# Patient Record
Sex: Female | Born: 1995 | Race: Black or African American | Hispanic: No | Marital: Single | Smoking: Former smoker
Health system: Southern US, Community
[De-identification: ages and names within clinical notes are randomized; demographics above are authoritative.]

## PROBLEM LIST (undated history)

## (undated) DIAGNOSIS — J3089 Other allergic rhinitis: Secondary | ICD-10-CM

## (undated) DIAGNOSIS — J45909 Unspecified asthma, uncomplicated: Secondary | ICD-10-CM

## (undated) DIAGNOSIS — L309 Dermatitis, unspecified: Secondary | ICD-10-CM

---

## 2018-10-21 ENCOUNTER — Emergency Department (HOSPITAL_BASED_OUTPATIENT_CLINIC_OR_DEPARTMENT_OTHER)
Admission: EM | Admit: 2018-10-21 | Discharge: 2018-10-22 | Disposition: A | Discharge status: Home / Self Care | Payer: Medicaid Other | Attending: Emergency Medicine | Admitting: Emergency Medicine

## 2018-10-21 ENCOUNTER — Encounter (HOSPITAL_BASED_OUTPATIENT_CLINIC_OR_DEPARTMENT_OTHER): Payer: Self-pay | Admitting: Emergency Medicine

## 2018-10-21 ENCOUNTER — Other Ambulatory Visit: Payer: Self-pay

## 2018-10-21 ENCOUNTER — Emergency Department (HOSPITAL_BASED_OUTPATIENT_CLINIC_OR_DEPARTMENT_OTHER)
Admission: EM | Admit: 2018-10-21 | Discharge: 2018-10-21 | Discharge status: Against Medical Advice | Payer: Medicaid Other | Attending: Emergency Medicine | Admitting: Emergency Medicine

## 2018-10-21 ENCOUNTER — Emergency Department (HOSPITAL_BASED_OUTPATIENT_CLINIC_OR_DEPARTMENT_OTHER): Payer: Medicaid Other

## 2018-10-21 DIAGNOSIS — Y999 Unspecified external cause status: Secondary | ICD-10-CM | POA: Diagnosis not present

## 2018-10-21 DIAGNOSIS — S60221A Contusion of right hand, initial encounter: Secondary | ICD-10-CM | POA: Diagnosis not present

## 2018-10-21 DIAGNOSIS — Y929 Unspecified place or not applicable: Secondary | ICD-10-CM | POA: Diagnosis not present

## 2018-10-21 DIAGNOSIS — W2209XA Striking against other stationary object, initial encounter: Secondary | ICD-10-CM | POA: Insufficient documentation

## 2018-10-21 DIAGNOSIS — Y939 Activity, unspecified: Secondary | ICD-10-CM | POA: Insufficient documentation

## 2018-10-21 DIAGNOSIS — S6981XA Other specified injuries of right wrist, hand and finger(s), initial encounter: Secondary | ICD-10-CM | POA: Diagnosis present

## 2018-10-21 MED ORDER — NAPROXEN 250 MG PO TABS
500.0000 mg | ORAL_TABLET | Freq: Once | ORAL | Status: AC
  Administered 2018-10-22: 500 mg via ORAL
  Filled 2018-10-21: qty 2

## 2018-10-21 NOTE — ED Triage Notes (Addendum)
Monday patient became upset and punched a thick mirror. Her knuckle was brusied but is now describing pain in the 5th metacarpal area of her right hand

## 2018-10-21 NOTE — ED Provider Notes (Addendum)
[facility] Provider Note: Georgena Spurling, MD, FACEP  CSN: [id number] MRN: [id number] ARRIVAL: 10/21/18 at 2325 ROOM: [place] [facility]   HISTORY OF PRESENT ILLNESS  10/21/18 11:46 PM Elizabeth Palmer is a 23 y.o. female who became angry 4 days ago and punched a thick mirror.  She had bruising of her right hand which is resolved but continues to have pain in the right fifth metacarpal.  She rates her pain as a 7 out of 10, aching in nature, worse with movement or palpation.  She also notes the pain is intermittent.  She presents this evening because the pain is been persistent.   History reviewed. No pertinent past medical history.  History reviewed. No pertinent surgical history.  No family history on file.  Social History   Tobacco Use  . Smoking status: Not on file  Substance Use Topics  . Alcohol use: Not on file  . Drug use: Not on file    Prior to Admission medications   Not on File    Allergies Patient has no known allergies.   REVIEW OF SYSTEMS  Negative except as noted here or in the History of Present Illness.   PHYSICAL EXAMINATION  Initial Vital Signs Blood pressure 122/72, pulse 68, temperature 98.1 F (36.7 C), temperature source Oral, resp. rate 18, height 5\' 3"  (1.6 m), weight 83.9 kg, last menstrual period 09/20/2018, SpO2 98 %.  Examination General: Well-developed, well-nourished female in no acute distress; appearance consistent with age of record HENT: normocephalic; atraumatic Eyes: Normal appearance Neck: supple Heart: regular rate and rhythm Lungs: clear to auscultation bilaterally Abdomen: soft; nondistended; nontender Extremities: No deformity; full range of motion; pulses normal; tenderness over right fifth metacarpal without crepitus or significant swelling Neurologic: Awake, alert and oriented; motor function intact in all extremities and symmetric; no facial droop Skin: Warm and dry  Psychiatric: Normal mood and affect   RESULTS  Summary of this visit's results, reviewed by myself:   EKG Interpretation  Date/Time:    Ventricular Rate:    PR Interval:    QRS Duration:   QT Interval:    QTC Calculation:   R Axis:     Text Interpretation:        Laboratory Studies: No results found for this or any previous visit (from the past 24 hour(s)). Imaging Studies: Dg Hand Complete Right  Result Date: 10/21/2018 CLINICAL DATA:  Punched a mirror EXAM: RIGHT HAND - COMPLETE 3+ VIEW COMPARISON:  None. FINDINGS: There is no evidence of fracture or dislocation. There is no evidence of arthropathy or other focal bone abnormality. Soft tissues are unremarkable. IMPRESSION: Negative. Electronically Signed   By: Donavan Foil M.D.   On: 10/21/2018 23:49    ED COURSE and MDM  Nursing notes and initial vitals signs, including pulse oximetry, reviewed.  Vitals:   10/21/18 2335 10/21/18 2338  BP:  122/72  Pulse:  68  Resp:  18  Temp:  98.1 F (36.7 C)  TempSrc:  Oral  SpO2:  98%  Weight: 83.9 kg   Height: 5\' 3"  (1.6 m)    No evidence of fracture on radiograph or physical examination.  Patient advised that punching a pillow is a safer alternative to punching a solid object.  She was advised to take over-the-counter acetaminophen or NSAIDs as needed for pain.  PROCEDURES    ED DIAGNOSES     ICD-10-CM   1. Contusion of right hand, initial encounter  [id number]        Paula LibraMolpus, Karmela Bram, MD 10/21/18 2356    Paula LibraMolpus, Daylani Deblois, MD 10/21/18 [phone]

## 2018-11-20 ENCOUNTER — Encounter (HOSPITAL_BASED_OUTPATIENT_CLINIC_OR_DEPARTMENT_OTHER): Payer: Self-pay

## 2018-11-20 ENCOUNTER — Emergency Department (HOSPITAL_BASED_OUTPATIENT_CLINIC_OR_DEPARTMENT_OTHER)
Admission: EM | Admit: 2018-11-20 | Discharge: 2018-11-21 | Disposition: A | Discharge status: Home / Self Care | Payer: Medicaid Other | Attending: Emergency Medicine | Admitting: Emergency Medicine

## 2018-11-20 ENCOUNTER — Emergency Department (HOSPITAL_BASED_OUTPATIENT_CLINIC_OR_DEPARTMENT_OTHER): Payer: Medicaid Other

## 2018-11-20 ENCOUNTER — Other Ambulatory Visit: Payer: Self-pay

## 2018-11-20 DIAGNOSIS — R197 Diarrhea, unspecified: Secondary | ICD-10-CM | POA: Diagnosis not present

## 2018-11-20 DIAGNOSIS — R1030 Lower abdominal pain, unspecified: Secondary | ICD-10-CM | POA: Diagnosis present

## 2018-11-20 LAB — URINALYSIS, MICROSCOPIC (REFLEX): RBC / HPF: NONE SEEN RBC/hpf (ref 0–5)

## 2018-11-20 LAB — PREGNANCY, URINE: Preg Test, Ur: NEGATIVE

## 2018-11-20 LAB — URINALYSIS, ROUTINE W REFLEX MICROSCOPIC
Bilirubin Urine: NEGATIVE
Glucose, UA: NEGATIVE mg/dL
Hgb urine dipstick: NEGATIVE
Ketones, ur: NEGATIVE mg/dL
Nitrite: NEGATIVE
Protein, ur: NEGATIVE mg/dL
Specific Gravity, Urine: >1.03 — ABNORMAL HIGH (ref 1.005–1.030)
pH: 6 (ref 5.0–8.0)

## 2018-11-20 MED ORDER — KETOROLAC TROMETHAMINE 30 MG/ML IJ SOLN
30.0000 mg | Freq: Once | INTRAMUSCULAR | Status: AC
  Administered 2018-11-20: 23:00:00 30 mg via INTRAMUSCULAR
  Filled 2018-11-20: qty 1

## 2018-11-20 MED ORDER — DICYCLOMINE HCL 20 MG PO TABS: 20.0000 mg | ORAL_TABLET | Freq: Two times a day (BID) | ORAL | 0 refills | Status: DC

## 2018-11-20 NOTE — ED Provider Notes (Signed)
[facility] EMERGENCY DEPARTMENT Provider Note   CSN: [id number] Arrival date & time: 11/20/18  2024     History   Chief Complaint Chief Complaint  Patient presents with  . Abdominal Pain    HPI Elizabeth Palmer is a 23 y.o. female.     HPI  This is a 23 year old female with no significant past medical history who presents with abdominal pain.  Patient reports 1 month history of waxing and waning abdominal pain.  She describes it as crampy in nature in the lower abdomen.  It is midline and does not lateralize.  She initially thought it was related to her period.  Last menstrual period was at the beginning of this month.  She describes having crampy symptoms before, during, and after her period.  However, the symptoms have persisted intermittently since that time.  She had symptoms tonight which provoked her to come in.  She did see her gynecologist and reports having a pelvic exam and STD testing that was reassuring.  She has been taking ibuprofen with some relief.  Currently she rates her pain at 3 out of 10.  She does report some nonbloody diarrhea which sometimes makes her pain feel better.  Denies any vomiting.  No fevers.  Denies urinary symptoms or vaginal discharge.  History reviewed. No pertinent past medical history.  There are no active problems to display for this patient.   History reviewed. No pertinent surgical history.   OB History   No obstetric history on file.      Home Medications    Prior to Admission medications   Medication Sig Start Date End Date Taking? Authorizing Provider  dicyclomine (BENTYL) 20 MG tablet Take 1 tablet (20 mg total) by mouth 2 (two) times daily. 11/20/18   Jori Frerichs, Mayer Maskerourtney F, MD    Family History No family history on file.  Social History Social History   Tobacco Use  . Smoking status: Never Smoker  Substance Use Topics  . Alcohol use: Yes    Frequency: Never  . Drug use: Never     Allergies   Patient has  no known allergies.   Review of Systems Review of Systems  Constitutional: Negative for chills and fever.  Respiratory: Negative for chest tightness.   Cardiovascular: Negative for chest pain.  Gastrointestinal: Positive for abdominal pain and diarrhea. Negative for nausea and vomiting.  Genitourinary: Negative for dysuria.  All other systems reviewed and are negative.    Physical Exam Updated Vital Signs BP 121/74 (BP Location: Right Arm)   Pulse 64   Temp 98.1 F (36.7 C) (Oral)   Resp 18   Ht 1.6 m (5\' 3" )   Wt 83 kg   LMP 11/02/2018   SpO2 100%   BMI 32.42 kg/m   Physical Exam Vitals signs and nursing note reviewed.  Constitutional:      Appearance: She is well-developed. She is obese.  HENT:     Head: Normocephalic and atraumatic.  Eyes:     Pupils: Pupils are equal, round, and reactive to light.  Neck:     Musculoskeletal: Neck supple.  Cardiovascular:     Rate and Rhythm: Normal rate and regular rhythm.     Heart sounds: Normal heart sounds.  Pulmonary:     Effort: Pulmonary effort is normal. No respiratory distress.     Breath sounds: No wheezing.  Abdominal:     General: Bowel sounds are normal.     Palpations: Abdomen is soft.  Tenderness: There is no abdominal tenderness. There is no guarding or rebound.  Genitourinary:    Comments: Deferred Skin:    General: Skin is warm and dry.  Neurological:     Mental Status: She is alert and oriented to person, place, and time.      ED Treatments / Results  Labs (all labs ordered are listed, but only abnormal results are displayed) Labs Reviewed  URINALYSIS, ROUTINE W REFLEX MICROSCOPIC - Abnormal; Notable for the following components:      Result Value   APPearance CLOUDY (*)    Specific Gravity, Urine >1.030 (*)    Leukocytes,Ua SMALL (*)    All other components within normal limits  URINALYSIS, MICROSCOPIC (REFLEX) - Abnormal; Notable for the following components:   Bacteria, UA RARE (*)     All other components within normal limits  PREGNANCY, URINE    EKG None  Radiology Dg Abdomen 1 View  Result Date: 11/20/2018 CLINICAL DATA:  Abdomen pain EXAM: ABDOMEN - 1 VIEW COMPARISON:  None. FINDINGS: The bowel gas pattern is normal. No radio-opaque calculi or other significant radiographic abnormality are seen. IMPRESSION: Negative. Electronically Signed   By: Donavan Foil M.D.   On: 11/20/2018 23:18    Procedures Procedures (including critical care time)  Medications Ordered in ED Medications  ketorolac (TORADOL) 30 MG/ML injection 30 mg (30 mg Intramuscular Given 11/20/18 2319)     Initial Impression / Assessment and Plan / ED Course  I have reviewed the triage vital signs and the nursing notes.  Pertinent labs & imaging results that were available during my care of the patient were reviewed by me and considered in my medical decision making (see chart for details).        Patient presents with crampy lower abdominal pain.  She is overall nontoxic-appearing and vital signs are reassuring.  Her abdominal exam is benign.  She reports recent pelvic exam which was unrevealing by her gynecologist.  This was deferred as she has a benign abdomen.  Urinalysis and urine pregnancy are reassuring.  I did get a plain film of her abdomen which shows a normal bowel gas pattern.  Given the diarrhea, patient may have undiagnosed IBS given her benign exam.  Low suspicion for appendicitis, ovarian torsion, ovarian cyst.  Will place on Bentyl and provide GI follow-up.  After history, exam, and medical workup I feel the patient has been appropriately medically screened and is safe for discharge home. Pertinent diagnoses were discussed with the patient. Patient was given return precautions.   Final Clinical Impressions(s) / ED Diagnoses   Final diagnoses:  Lower abdominal pain  Diarrhea, unspecified type    ED Discharge Orders         Ordered    dicyclomine (BENTYL) 20 MG tablet  2  times daily     11/20/18 2346           Kaimen Peine, Barbette Hair, MD 11/21/18 0000

## 2018-11-20 NOTE — Discharge Instructions (Addendum)
You were seen today for abdominal pain and diarrhea.  Your x-ray is reassuring.  Try Bentyl at home to see if this helps.  Given the diarrhea, you may need to follow-up with gastroenterology if symptoms are not improving.

## 2018-11-20 NOTE — ED Triage Notes (Signed)
Pt reports abdominal pain/pelvic pain x 1 month. Pt reports hx of ovarian cysts. Pt reports pain feels like menstrual cramps but started before her period and has lasted after. Pt does report diarrhea. Pt saw OBGYN with swabs but no [place]. Pt was informed it was just gas.

## 2019-09-20 ENCOUNTER — Emergency Department (HOSPITAL_BASED_OUTPATIENT_CLINIC_OR_DEPARTMENT_OTHER)
Admission: EM | Admit: 2019-09-20 | Discharge: 2019-09-20 | Disposition: A | Discharge status: Home / Self Care | Payer: Medicaid Other | Attending: Emergency Medicine | Admitting: Emergency Medicine

## 2019-09-20 ENCOUNTER — Encounter (HOSPITAL_BASED_OUTPATIENT_CLINIC_OR_DEPARTMENT_OTHER): Payer: Self-pay | Admitting: *Deleted

## 2019-09-20 ENCOUNTER — Other Ambulatory Visit: Payer: Self-pay

## 2019-09-20 DIAGNOSIS — R519 Headache, unspecified: Secondary | ICD-10-CM

## 2019-09-20 DIAGNOSIS — Z79899 Other long term (current) drug therapy: Secondary | ICD-10-CM | POA: Diagnosis not present

## 2019-09-20 LAB — PREGNANCY, URINE: Preg Test, Ur: NEGATIVE

## 2019-09-20 MED ORDER — DIPHENHYDRAMINE HCL 50 MG/ML IJ SOLN: 25.0000 mg | Freq: Once | INTRAMUSCULAR | Status: DC

## 2019-09-20 MED ORDER — SODIUM CHLORIDE 0.9 % IV BOLUS: 500.0000 mL | Freq: Once | INTRAVENOUS | Status: DC

## 2019-09-20 MED ORDER — METOCLOPRAMIDE HCL 5 MG/ML IJ SOLN: 10.0000 mg | Freq: Once | INTRAMUSCULAR | Status: DC

## 2019-09-20 MED ORDER — DIPHENHYDRAMINE HCL 25 MG PO CAPS
25.0000 mg | ORAL_CAPSULE | Freq: Once | ORAL | Status: AC
  Administered 2019-09-20: 25 mg via ORAL
  Filled 2019-09-20: qty 1

## 2019-09-20 MED ORDER — METOCLOPRAMIDE HCL 10 MG PO TABS
5.0000 mg | ORAL_TABLET | Freq: Once | ORAL | Status: AC
  Administered 2019-09-20: 5 mg via ORAL
  Filled 2019-09-20: qty 1

## 2019-09-20 NOTE — ED Triage Notes (Signed)
Presents with HA with onset approx 3 days ago, HA is more active when out in the heat

## 2019-09-20 NOTE — ED Provider Notes (Signed)
[facility] EMERGENCY DEPARTMENT Provider Note   CSN: [id number] Arrival date & time: 09/20/19  0913     History Chief Complaint  Patient presents with  . Headache    Elizabeth Palmer is a 24 y.o. female otherwise healthy no daily medication use.  Patient presents today with headache onset 4 days ago described as a frontal throbbing sensation occasionally moves around to different spots of her head, moderate in intensity.  Patient reports that Saturday, 09/16/2019 when the headache began it was mild but gradually worsened throughout the day. She reports headache is worsened with bright light and occasionally when she is out in the hot weather.  It improves with OTC medications such as ibuprofen last taken yesterday, no medications today.  Patient reports that she has been experiencing similar headaches for the past 1-2 years but has not seen a medical provider for this in the past.  She reports that her headache today is very similar to the headache she has been experiencing for the past 1-2 years however feels that has been lasting longer than normal.  Patient is accompanied by her mother today, mother reports that she herself experiences similar headaches and migraines since she was young.  They are requesting referral to a neurologist.  Denies recent illness, no fevers/chills, denies vision changes, dizziness, difficulty speaking, neck stiffness, chest pain/shortness of breath, abdominal pain, vomiting/diarrhea, numbness/weakness, tingling, sudden onset of pain, abnormal features to the headache or any additional concerns.  HPI     History reviewed. No pertinent past medical history.  There are no problems to display for this patient.   History reviewed. No pertinent surgical history.   OB History   No obstetric history on file.     History reviewed. No pertinent family history.  Social History   Tobacco Use  . Smoking status: Never Smoker  Substance Use Topics    . Alcohol use: Yes  . Drug use: Never    Home Medications Prior to Admission medications   Medication Sig Start Date End Date Taking? Authorizing Provider  dicyclomine (BENTYL) 20 MG tablet Take 1 tablet (20 mg total) by mouth 2 (two) times daily. 11/20/18   Horton, Mayer Masker, MD    Allergies    Patient has no known allergies.  Review of Systems   Review of Systems Ten systems are reviewed and are negative for acute change except as noted in the HPI  Physical Exam Updated Vital Signs BP 120/79 (BP Location: Right Arm)   Pulse 66   Temp 97.9 F (36.6 C) (Oral)   Resp 16   Ht 5\' 2"  (1.575 m)   Wt 87.5 kg   SpO2 100%   BMI 35.30 kg/m   Physical Exam Constitutional:      General: She is not in acute distress.    Appearance: Normal appearance. She is well-developed. She is not ill-appearing or diaphoretic.  HENT:     Head: Normocephalic and atraumatic.     Jaw: There is normal jaw occlusion.     Right Ear: External ear normal.     Left Ear: External ear normal.     Nose: Nose normal.     Mouth/Throat:     Mouth: Mucous membranes are moist.     Pharynx: Oropharynx is clear.  Eyes:     General: Vision grossly intact. Gaze aligned appropriately.     Pupils: Pupils are equal, round, and reactive to light.  Neck:     Trachea: Trachea and phonation normal.  No tracheal deviation.     Meningeal: Brudzinski's sign absent.  Pulmonary:     Effort: Pulmonary effort is normal. No respiratory distress.  Abdominal:     General: There is no distension.     Palpations: Abdomen is soft.     Tenderness: There is no abdominal tenderness. There is no guarding or rebound.  Musculoskeletal:        General: Normal range of motion.     Cervical back: Full passive range of motion without pain, normal range of motion and neck supple.  Skin:    General: Skin is warm and dry.  Neurological:     Mental Status: She is alert.     GCS: GCS eye subscore is 4. GCS verbal subscore is 5. GCS  motor subscore is 6.     Comments: Mental Status: Alert, oriented, thought content appropriate, able to give a coherent history. Speech fluent without evidence of aphasia. Able to follow 2 step commands without difficulty. Cranial Nerves: II: Peripheral visual fields grossly normal, pupils equal, round, reactive to light III,IV, VI: ptosis not present, extra-ocular motions intact bilaterally V,VII: smile symmetric, eyebrows raise symmetric, facial light touch sensation equal VIII: hearing grossly normal to voice X: uvula elevates symmetrically XI: bilateral shoulder shrug symmetric and strong XII: midline tongue extension without fassiculations Motor: Normal tone. 5/5 strength in upper and lower extremities bilaterally including strong and equal grip strength and dorsiflexion/plantar flexion Sensory: Sensation intact to light touch in all extremities.Negative Romberg.  Cerebellar: normal finger-to-nose with bilateral upper extremities. Normal heel-to -shin balance bilaterally of the lower extremity. No pronator drift.  Gait: normal gait and balance CV: distal pulses palpable throughout  Psychiatric:        Behavior: Behavior normal.     ED Results / Procedures / Treatments   Labs (all labs ordered are listed, but only abnormal results are displayed) Labs Reviewed  PREGNANCY, URINE    EKG None  Radiology No results found.  Procedures Procedures (including critical care time)  Medications Ordered in ED Medications  metoCLOPramide (REGLAN) tablet 5 mg (5 mg Oral Given 09/20/19 1038)  diphenhydrAMINE (BENADRYL) capsule 25 mg (25 mg Oral Given 09/20/19 1039)    ED Course  I have reviewed the triage vital signs and the nursing notes.  Pertinent labs & imaging results that were available during my care of the patient were reviewed by me and considered in my medical decision making (see chart for details).    MDM Rules/Calculators/A&P                      Additional  History Obtained: 1. Nursing notes from this visit. 2. Patient's mother at bedside.  24 year old female otherwise healthy no daily medication use presents today with headache onset 4 days ago.  She reports is consistent with previous headaches however longer in duration and slightly worse.  She has been treating successfully at home with OTC anti-inflammatories however has not had any today.  Physical examination is reassuring no neuro deficits.  She denies any red flag symptoms such as sudden onset, syncope, recent illness or trauma.  She has no meningeal signs on examination no tenderness about the cervical facial or temporal arteries.  No other red flag symptoms today.  She is well-appearing and in no acute distress.  She is walking around the emergency department and laughing with nursing staff.  Plan of care was to obtain a pregnancy test and to start IV to give migraine cocktail  however patient refused IV medications.  I have ordered p.o. Reglan and Benadryl instead.  Plan of care is reassessment after medication.  There is no indication for CT imaging or blood work at this time.  I did have a discussion regarding potential imaging with patient and her mother today, they are in agreement and do not want any imaging or blood work which I feel is reasonable. - I have reviewed and interpreted the following labs. Urine pregnancy test negative. - Patient reevaluated well-appearing pleasant no acute distress sitting in bed.  She reports that she is feeling better and would like to be discharged.  She is requesting a work note and a referral to a neurologist.  Both of which will be provided. The patient denies any neurologic symptoms such as visual changes, focal numbness/weakness, balance problems, confusion, or speech difficulty to suggest a life-threatening intracranial process such as intracranial hemorrhage or mass. The patient has no clotting risk factors thus venous sinus thrombosis is unlikely. No  fevers, neck pain or nuchal rigidity to suggest meningitis. Patient is afebrile, non-toxic and well appearing. Reassuring neuro exam, normal gait around room and back. No cranial deficits, no speech deficits, negative pronator drift, normal/equal strength to all extremities.  Both patient and mother report they have no other concerns today and both request discharge.  At this time there does not appear to be any evidence of an acute emergency medical condition and the patient appears stable for discharge with appropriate outpatient follow up. Diagnosis was discussed with patient and mother who verbalizes understanding of care plan and is agreeable to discharge. I have discussed return precautions with patient and mother who verbalizes understanding. Patient encouraged to follow-up with their PCP. All questions answered.  Note: Portions of this report may have been transcribed using voice recognition software. Every effort was made to ensure accuracy; however, inadvertent computerized transcription errors may still be present. Final Clinical Impression(s) / ED Diagnoses Final diagnoses:  Nonintractable headache, unspecified chronicity pattern, unspecified headache type    Rx / DC Orders ED Discharge Orders    None       Elizabeth Palau 09/20/19 1158    Pricilla Loveless, MD 09/21/19 [phone]

## 2019-09-20 NOTE — Discharge Instructions (Signed)
At this time there does not appear to be the presence of an emergent medical condition, however there is always the potential for conditions to change. Please read and follow the below instructions.  Please return to the Emergency Department immediately for any new or worsening symptoms. Please be sure to follow up with your Primary Care Provider within one week regarding your visit today; please call their office to schedule an appointment even if you are feeling better for a follow-up visit. You may call the specialist at [facility] to schedule a follow-up appointment.  Get help right away if: Your headache gets very bad quickly. Your headache gets worse after a lot of physical activity. You keep throwing up. You have a stiff neck. You have trouble seeing. You have trouble speaking. You have pain in the eye or ear. Your muscles are weak or you lose muscle control. You lose your balance or have trouble walking. You feel like you will pass out (faint) or you pass out. You are mixed up (confused). You have a seizure. You have fever or chills You have any new/concerning or worsening of symptoms  Please read the additional information packets attached to your discharge summary.  Do not take your medicine if  develop an itchy rash, swelling in your mouth or lips, or difficulty breathing; call 911 and seek immediate emergency medical attention if this occurs.  Note: Portions of this text may have been transcribed using voice recognition software. Every effort was made to ensure accuracy; however, inadvertent computerized transcription errors may still be present.

## 2020-05-15 ENCOUNTER — Emergency Department (HOSPITAL_BASED_OUTPATIENT_CLINIC_OR_DEPARTMENT_OTHER)
Admission: EM | Admit: 2020-05-15 | Discharge: 2020-05-15 | Disposition: A | Discharge status: Home / Self Care | Payer: Medicaid Other | Attending: Emergency Medicine | Admitting: Emergency Medicine

## 2020-05-15 ENCOUNTER — Encounter (HOSPITAL_BASED_OUTPATIENT_CLINIC_OR_DEPARTMENT_OTHER): Payer: Self-pay | Admitting: Emergency Medicine

## 2020-05-15 ENCOUNTER — Other Ambulatory Visit: Payer: Self-pay

## 2020-05-15 DIAGNOSIS — R110 Nausea: Secondary | ICD-10-CM | POA: Diagnosis present

## 2020-05-15 DIAGNOSIS — U071 COVID-19: Secondary | ICD-10-CM | POA: Insufficient documentation

## 2020-05-15 DIAGNOSIS — B349 Viral infection, unspecified: Secondary | ICD-10-CM

## 2020-05-15 LAB — URINALYSIS, MICROSCOPIC (REFLEX)

## 2020-05-15 LAB — CBC WITH DIFFERENTIAL/PLATELET
Abs Immature Granulocytes: 0.03 K/uL (ref 0.00–0.07)
Basophils Absolute: 0 K/uL (ref 0.0–0.1)
Basophils Relative: 1 %
Eosinophils Absolute: 0 K/uL (ref 0.0–0.5)
Eosinophils Relative: 1 %
HCT: 42.1 % (ref 36.0–46.0)
Hemoglobin: 14.6 g/dL (ref 12.0–15.0)
Immature Granulocytes: 1 %
Lymphocytes Relative: 19 %
Lymphs Abs: 1 K/uL (ref 0.7–4.0)
MCH: 30.6 pg (ref 26.0–34.0)
MCHC: 34.7 g/dL (ref 30.0–36.0)
MCV: 88.3 fL (ref 80.0–100.0)
Monocytes Absolute: 0.7 K/uL (ref 0.1–1.0)
Monocytes Relative: 14 %
Neutro Abs: 3.6 K/uL (ref 1.7–7.7)
Neutrophils Relative %: 64 %
Platelets: 212 K/uL (ref 150–400)
RBC: 4.77 MIL/uL (ref 3.87–5.11)
RDW: 12.8 % (ref 11.5–15.5)
WBC: 5.5 K/uL (ref 4.0–10.5)
nRBC: 0 % (ref 0.0–0.2)

## 2020-05-15 LAB — URINALYSIS, ROUTINE W REFLEX MICROSCOPIC
Bilirubin Urine: NEGATIVE
Glucose, UA: NEGATIVE mg/dL
Ketones, ur: NEGATIVE mg/dL
Leukocytes,Ua: NEGATIVE
Nitrite: NEGATIVE
Protein, ur: NEGATIVE mg/dL
Specific Gravity, Urine: 1.02 (ref 1.005–1.030)
pH: 6 (ref 5.0–8.0)

## 2020-05-15 LAB — BASIC METABOLIC PANEL WITH GFR
Anion gap: 12 (ref 5–15)
BUN: 6 mg/dL (ref 6–20)
CO2: 19 mmol/L — ABNORMAL LOW (ref 22–32)
Calcium: 9.5 mg/dL (ref 8.9–10.3)
Chloride: 103 mmol/L (ref 98–111)
Creatinine, Ser: 0.83 mg/dL (ref 0.44–1.00)
GFR, Estimated: >60 mL/min
Glucose, Bld: 103 mg/dL — ABNORMAL HIGH (ref 70–99)
Potassium: 3.4 mmol/L — ABNORMAL LOW (ref 3.5–5.1)
Sodium: 134 mmol/L — ABNORMAL LOW (ref 135–145)

## 2020-05-15 LAB — SARS CORONAVIRUS 2 (TAT 6-24 HRS): SARS Coronavirus 2: POSITIVE — AB

## 2020-05-15 LAB — PREGNANCY, URINE: Preg Test, Ur: NEGATIVE

## 2020-05-15 MED ORDER — SODIUM CHLORIDE 0.9 % IV BOLUS
500.0000 mL | Freq: Once | INTRAVENOUS | Status: AC
  Administered 2020-05-15: 500 mL via INTRAVENOUS

## 2020-05-15 MED ORDER — KETOROLAC TROMETHAMINE 15 MG/ML IJ SOLN
15.0000 mg | Freq: Once | INTRAMUSCULAR | Status: AC
  Administered 2020-05-15: 15 mg via INTRAVENOUS
  Filled 2020-05-15: qty 1

## 2020-05-15 MED ORDER — PROMETHAZINE HCL 25 MG/ML IJ SOLN
12.5000 mg | Freq: Once | INTRAMUSCULAR | Status: AC
  Administered 2020-05-15: 12.5 mg via INTRAVENOUS
  Filled 2020-05-15: qty 1

## 2020-05-15 MED ORDER — ONDANSETRON 4 MG PO TBDP: 4.0000 mg | ORAL_TABLET | Freq: Three times a day (TID) | ORAL | 0 refills | Status: DC | PRN

## 2020-05-15 NOTE — ED Triage Notes (Addendum)
Nausea , cramping and  Has been hot and cold , no sleeping well for few days . LMP 2 -3 weeksago ,  Denies dysuria , or abnormal d/c

## 2020-05-15 NOTE — Discharge Instructions (Addendum)
The different complaints are likely due to a virus. With a headache and abdominal pain try and keep yourself hydrated. Your COVID test has been done but has not resulted yet. Watch for worsening abdominal pain particularly if it localizes.

## 2020-05-15 NOTE — ED Notes (Signed)
Mom at bedside Pt ambulatory to [facility]

## 2020-05-15 NOTE — ED Provider Notes (Signed)
[facility] EMERGENCY DEPARTMENT Provider Note   CSN: [id number] Arrival date & time: 05/15/20  0626     History Chief Complaint  Patient presents with  . Nausea    Elizabeth Palmer is a 25 y.o. female.  HPI Patient presents with multiple complaints.  Nausea cramping abdominal pain.  Cough nasal congestion headaches has had for last few days.  Has not been sleeping well.  States she took a shower and has been feeling hot and cold since.  Has not had her complete COVID vaccinations.  No dysuria.  States she just feels weak all over.  No known COVID contacts.  Abdominal pain is somewhat dull and in the lower abdomen.  Worse history.  His symptoms    History reviewed. No pertinent past medical history.  There are no problems to display for this patient.   History reviewed. No pertinent surgical history.   OB History   No obstetric history on file.     No family history on file.  Social History   Tobacco Use  . Smoking status: Never Smoker  . Smokeless tobacco: Never Used  Vaping Use  . Vaping Use: Never used  Substance Use Topics  . Alcohol use: Yes  . Drug use: Never    Home Medications Prior to Admission medications   Medication Sig Start Date End Date Taking? Authorizing Provider  ondansetron (ZOFRAN-ODT) 4 MG disintegrating tablet Take 1 tablet (4 mg total) by mouth every 8 (eight) hours as needed for nausea or vomiting. 05/15/20  Yes Benjiman Core, MD  dicyclomine (BENTYL) 20 MG tablet Take 1 tablet (20 mg total) by mouth 2 (two) times daily. 11/20/18   Horton, Mayer Masker, MD    Allergies    Patient has no known allergies.  Review of Systems   Review of Systems  Constitutional: Positive for appetite change and chills.  HENT: Positive for congestion.   Respiratory: Positive for cough.   Cardiovascular: Negative for chest pain.  Gastrointestinal: Positive for abdominal pain and nausea.  Genitourinary: Negative for dysuria.  Musculoskeletal:  Positive for myalgias.  Skin: Negative for rash.  Neurological: Positive for headaches.  Psychiatric/Behavioral: Negative for confusion.    Physical Exam Updated Vital Signs BP 91/62 (BP Location: Left Arm)   Pulse (!) 55   Temp 97.8 F (36.6 C) (Oral)   Resp 16   Ht 5\' 3"  (1.6 m)   Wt 83.9 kg   SpO2 99%   BMI 32.77 kg/m   Physical Exam Vitals and nursing note reviewed.  HENT:     Head: Atraumatic.     Right Ear: External ear normal.     Left Ear: External ear normal.     Mouth/Throat:     Mouth: Mucous membranes are moist.     Pharynx: No posterior oropharyngeal erythema.  Eyes:     General: No scleral icterus. Cardiovascular:     Rate and Rhythm: Regular rhythm.  Pulmonary:     Effort: Pulmonary effort is normal.     Breath sounds: No wheezing or rhonchi.  Abdominal:     Tenderness: There is no abdominal tenderness.  Musculoskeletal:        General: No tenderness.     Cervical back: Neck supple.  Skin:    General: Skin is warm.     Capillary Refill: Capillary refill takes less than 2 seconds.  Neurological:     Mental Status: She is alert and oriented to person, place, and time.  ED Results / Procedures / Treatments   Labs (all labs ordered are listed, but only abnormal results are displayed) Labs Reviewed  URINALYSIS, ROUTINE W REFLEX MICROSCOPIC - Abnormal; Notable for the following components:      Result Value   Hgb urine dipstick SMALL (*)    All other components within normal limits  URINALYSIS, MICROSCOPIC (REFLEX) - Abnormal; Notable for the following components:   Bacteria, UA MANY (*)    All other components within normal limits  BASIC METABOLIC PANEL - Abnormal; Notable for the following components:   Sodium 134 (*)    Potassium 3.4 (*)    CO2 19 (*)    Glucose, Bld 103 (*)    All other components within normal limits  SARS CORONAVIRUS 2 (TAT 6-24 HRS)  PREGNANCY, URINE  CBC WITH DIFFERENTIAL/PLATELET    EKG None  Radiology No  results found.  Procedures Procedures (including critical care time)  Medications Ordered in ED Medications  promethazine (PHENERGAN) injection 12.5 mg (12.5 mg Intravenous Given 05/15/20 1012)  ketorolac (TORADOL) 15 MG/ML injection 15 mg (15 mg Intravenous Given 05/15/20 1011)  sodium chloride 0.9 % bolus 500 mL (0 mLs Intravenous Stopped 05/15/20 1129)    ED Course  I have reviewed the triage vital signs and the nursing notes.  Pertinent labs & imaging results that were available during my care of the patient were reviewed by me and considered in my medical decision making (see chart for details).    MDM Rules/Calculators/A&P                          Patient presents with multiple complaints.  Headache chills nausea abdominal pain cramps weakness.  Lab work overall reassuring.  I think most likely this is more of a viral syndrome.  Benign abdominal exam and really does not have tenderness.  COVID test done and still pending.  Feels better after treatment.  Nausea improved.  Will discharge with antiemetics.  Mild dehydration on labs.  Discussed with patient about some warning signs for worsening abdominal pain or localization.  Will discharge home Final Clinical Impression(s) / ED Diagnoses Final diagnoses:  Viral infection    Rx / DC Orders ED Discharge Orders         Ordered    ondansetron (ZOFRAN-ODT) 4 MG disintegrating tablet  Every 8 hours PRN        05/15/20 1118           Benjiman Core, MD 05/15/20 1219

## 2020-08-30 ENCOUNTER — Emergency Department (HOSPITAL_BASED_OUTPATIENT_CLINIC_OR_DEPARTMENT_OTHER)
Admission: EM | Admit: 2020-08-30 | Discharge: 2020-08-30 | Disposition: A | Discharge status: Home / Self Care | Payer: Medicaid Other | Attending: Emergency Medicine | Admitting: Emergency Medicine

## 2020-08-30 ENCOUNTER — Encounter (HOSPITAL_BASED_OUTPATIENT_CLINIC_OR_DEPARTMENT_OTHER): Payer: Self-pay | Admitting: *Deleted

## 2020-08-30 ENCOUNTER — Other Ambulatory Visit: Payer: Self-pay

## 2020-08-30 DIAGNOSIS — L304 Erythema intertrigo: Secondary | ICD-10-CM | POA: Insufficient documentation

## 2020-08-30 DIAGNOSIS — L309 Dermatitis, unspecified: Secondary | ICD-10-CM

## 2020-08-30 DIAGNOSIS — L209 Atopic dermatitis, unspecified: Secondary | ICD-10-CM | POA: Diagnosis not present

## 2020-08-30 DIAGNOSIS — B372 Candidiasis of skin and nail: Secondary | ICD-10-CM

## 2020-08-30 DIAGNOSIS — R21 Rash and other nonspecific skin eruption: Secondary | ICD-10-CM | POA: Diagnosis present

## 2020-08-30 HISTORY — DX: Dermatitis, unspecified: L30.9

## 2020-08-30 MED ORDER — HYDROCORTISONE 2.5 % EX CREA: TOPICAL_CREAM | Freq: Two times a day (BID) | CUTANEOUS | 0 refills | Status: AC

## 2020-08-30 MED ORDER — CLOTRIMAZOLE 1 % EX CREA: TOPICAL_CREAM | Freq: Two times a day (BID) | CUTANEOUS | 0 refills | Status: AC

## 2020-08-30 NOTE — ED Triage Notes (Signed)
C/o  Extreme eczema x 2 months

## 2020-08-30 NOTE — ED Provider Notes (Signed)
[facility] EMERGENCY DEPARTMENT Provider Note   CSN: [id number] Arrival date & time: 08/30/20  1556     History Chief Complaint  Patient presents with  . Rash    Elizabeth Palmer is a 25 y.o. female with history of eczema who presents with concern for outbreak of eczema for the last 8 weeks in the flexor surfaces primarily in her elbows and the antecubital space and the backs of her knees.  Patient does also have it around her umbilicus.  Of note patient has itchy skin changes underneath the right breast fold and in the intertriginous area of the pannus that has been itching and mildly painful.  Patient states she cannot get into see her dermatologist on 5/17 and has not been able to achieve relief with Eucrisa that was previously prescribed or oatmeal baths.  Patient has been administering lotion without relief as well as topical Benadryl cream.  Patient did receive some relief from hydrocortisone spray but does not have any topical hydrocortisone cream.  Triamcinolone not effective.  I personally reviewed this patient's medical records.  History of eczema but otherwise carries no medical diagnoses and is not on any other medications daily.  HPI     Past Medical History:  Diagnosis Date  . Eczema     There are no problems to display for this patient.   History reviewed. No pertinent surgical history.   OB History   No obstetric history on file.     No family history on file.  Social History   Tobacco Use  . Smoking status: Never Smoker  . Smokeless tobacco: Never Used  Vaping Use  . Vaping Use: Never used  Substance Use Topics  . Alcohol use: Yes  . Drug use: Never    Home Medications Prior to Admission medications   Medication Sig Start Date End Date Taking? Authorizing Provider  clotrimazole (LOTRIMIN) 1 % cream Apply topically 2 (two) times daily for 10 days. Apply to affected area 2 times daily under the breast folds, and under the abdominal fold  08/30/20 09/09/20 Yes Elizabeth Granade R, PA-C  hydrocortisone 2.5 % cream Apply topically 2 (two) times daily for 7 days. 08/30/20 09/06/20 Yes Elizabeth Palmer, Elizabeth Gavia, PA-C  dicyclomine (BENTYL) 20 MG tablet Take 1 tablet (20 mg total) by mouth 2 (two) times daily. 11/20/18   Elizabeth Palmer, Elizabeth Masker, MD  ondansetron (ZOFRAN-ODT) 4 MG disintegrating tablet Take 1 tablet (4 mg total) by mouth every 8 (eight) hours as needed for nausea or vomiting. 05/15/20   Elizabeth Core, MD    Allergies    Patient has no known allergies.  Review of Systems   Review of Systems  Constitutional: Negative.   Respiratory: Negative.   Cardiovascular: Negative.   Gastrointestinal: Negative.   Genitourinary: Negative.   Skin: Positive for rash.  Allergic/Immunologic: Negative.   Neurological: Negative.     Physical Exam Updated Vital Signs BP 139/81 (BP Location: Right Arm)   Pulse 69   Temp 98.5 F (36.9 C) (Oral)   Resp 16   Ht 5\' 2"  (1.575 m)   Wt 83.9 kg   LMP 08/06/2020   SpO2 98%   BMI 33.84 kg/m   Physical Exam Vitals and nursing note reviewed.  HENT:     Head: Normocephalic and atraumatic.  Eyes:     General: No scleral icterus.       Right eye: No discharge.        Left eye: No discharge.  Extraocular Movements: Extraocular movements intact.     Conjunctiva/sclera: Conjunctivae normal.     Pupils: Pupils are equal, round, and reactive to light.  Cardiovascular:     Rate and Rhythm: Normal rate and regular rhythm.  Pulmonary:     Effort: Pulmonary effort is normal.  Chest:     Chest wall: No lacerations, deformity, swelling, tenderness, crepitus or edema.    Musculoskeletal:        General: No swelling or deformity.     Right lower leg: No edema.     Left lower leg: No edema.  Skin:    General: Skin is warm and dry.     Capillary Refill: Capillary refill takes less than 2 seconds.     Findings: Rash present. Rash is crusting. Rash is not vesicular.       Neurological:      General: No focal deficit present.     Mental Status: She is alert and oriented to person, place, and time.     Sensory: Sensation is intact.     Motor: Motor function is intact.  Psychiatric:        Mood and Affect: Mood normal.     ED Results / Procedures / Treatments   Labs (all labs ordered are listed, but only abnormal results are displayed) Labs Reviewed - No data to display  EKG None  Radiology No results found.  Procedures Procedures  Medications Ordered in ED Medications - No data to display  ED Course  I have reviewed the triage vital signs and the nursing notes.  Pertinent labs & imaging results that were available during my care of the patient were reviewed by me and considered in my medical decision making (see chart for details).    MDM Rules/Calculators/A&P                         25 year old female with history of atopic dermatitis who presents with concern for a weeks of progressively worsening pruritus and dry skin in the flexor folds as well as new itching under the right breast and in the folds of the pannus.  Differential diagnosis includes but is not limited to atopic dermatitis, candidal dermatitis/intertrigo, contact dermatitis, stasis dermatitis.  Vitals are normal on intake.  Cardiopulmonary exam is normal, abdominal exam is benign.  Patient's rash in the flexor spaces is dry, flaking, thickened most consistent with eczema (atopic dermatitis).  Will discharge with course of hydrocortisone cream and instructions to discontinue topical triamcinolone.  In regards to the rash found beneath her breast and in the folds of her pannus, this rash appears to be more candidal in nature.  Will prescribe clotrimazole topically to treat this infection.  Recommend she keep her appointment as previously scheduled with her dermatologist for 09/10/2020.  May begin to take OTC allergy medication to suppress immune response to environmental allergens.  No further work-up  warranted in the ED at this time. Elizabeth Palmer voiced understanding of her medical evaluation and treatment plan.  Each of her questions was answered to her expressed satisfaction.  Return precautions were given.  Patient is well-appearing, stable, and appropriate for discharge.  This chart was dictated using voice recognition software, Dragon. Despite the best efforts of this provider to proofread and correct errors, errors may still occur which can change documentation meaning.  Final Clinical Impression(s) / ED Diagnoses Final diagnoses:  Eczema, unspecified type  Candidal intertrigo    Rx / DC Orders ED Discharge  Orders         Ordered    hydrocortisone 2.5 % cream  2 times daily        08/30/20 1803    clotrimazole (LOTRIMIN) 1 % cream  2 times daily        08/30/20 1803           Ladarius Seubert, Elizabeth Gavia, PA-C 08/30/20 1823    Little, Ambrose Finland, MD 08/30/20 [phone]

## 2020-08-30 NOTE — Discharge Instructions (Addendum)
You were seen in the ER today for your rashes.  It appears you have a yeast infection of the skin underneath your right breast and under the fold her abdomen.  You have been prescribed a medication called clotrimazole to apply topically to these areas twice a day for the next 7 to 10 days.  To manage your eczema you have been prescribed a hydrocortisone cream which you should apply twice daily for a maximum of 7 days.  Please follow-up with your dermatologist.  Additionally please apply emollients such as Aquaphor or Vaseline to the affected areas and avoid excessive bathing, fragrances, and water-based lotions.  Return to the ER if you develop any new severe symptoms.

## 2020-08-30 NOTE — ED Notes (Signed)
States suffers from eczema and has an out break of such for last 2 months getting worse in last week.  Rash to folds of arms bilateral; around naval and folds of abdominal  Complaints of "extreme" itchiness.   States ointment dermatologist order not working.  Unsure of name of medication.

## 2020-08-31 ENCOUNTER — Telehealth (HOSPITAL_BASED_OUTPATIENT_CLINIC_OR_DEPARTMENT_OTHER): Payer: Self-pay | Admitting: Emergency Medicine

## 2021-03-28 ENCOUNTER — Observation Stay (HOSPITAL_BASED_OUTPATIENT_CLINIC_OR_DEPARTMENT_OTHER)
Admission: EM | Admit: 2021-03-28 | Discharge: 2021-03-29 | Disposition: A | Discharge status: Home / Self Care | Payer: Medicaid Other | Attending: Internal Medicine | Admitting: Internal Medicine

## 2021-03-28 ENCOUNTER — Other Ambulatory Visit: Payer: Self-pay

## 2021-03-28 ENCOUNTER — Emergency Department (HOSPITAL_BASED_OUTPATIENT_CLINIC_OR_DEPARTMENT_OTHER): Payer: Medicaid Other

## 2021-03-28 ENCOUNTER — Encounter (HOSPITAL_BASED_OUTPATIENT_CLINIC_OR_DEPARTMENT_OTHER): Payer: Self-pay

## 2021-03-28 ENCOUNTER — Emergency Department (HOSPITAL_BASED_OUTPATIENT_CLINIC_OR_DEPARTMENT_OTHER)
Admission: EM | Admit: 2021-03-28 | Discharge: 2021-03-28 | Disposition: A | Discharge status: Home / Self Care | Payer: Medicaid Other | Source: Home / Self Care | Attending: Emergency Medicine | Admitting: Emergency Medicine

## 2021-03-28 ENCOUNTER — Encounter (HOSPITAL_BASED_OUTPATIENT_CLINIC_OR_DEPARTMENT_OTHER): Payer: Self-pay | Admitting: *Deleted

## 2021-03-28 DIAGNOSIS — Z7951 Long term (current) use of inhaled steroids: Secondary | ICD-10-CM | POA: Insufficient documentation

## 2021-03-28 DIAGNOSIS — R0600 Dyspnea, unspecified: Secondary | ICD-10-CM | POA: Diagnosis present

## 2021-03-28 DIAGNOSIS — F1220 Cannabis dependence, uncomplicated: Secondary | ICD-10-CM | POA: Diagnosis present

## 2021-03-28 DIAGNOSIS — E6609 Other obesity due to excess calories: Secondary | ICD-10-CM

## 2021-03-28 DIAGNOSIS — R0602 Shortness of breath: Principal | ICD-10-CM | POA: Insufficient documentation

## 2021-03-28 DIAGNOSIS — Z20822 Contact with and (suspected) exposure to covid-19: Secondary | ICD-10-CM | POA: Insufficient documentation

## 2021-03-28 DIAGNOSIS — R052 Subacute cough: Secondary | ICD-10-CM | POA: Insufficient documentation

## 2021-03-28 DIAGNOSIS — N939 Abnormal uterine and vaginal bleeding, unspecified: Secondary | ICD-10-CM | POA: Insufficient documentation

## 2021-03-28 DIAGNOSIS — R062 Wheezing: Secondary | ICD-10-CM | POA: Insufficient documentation

## 2021-03-28 DIAGNOSIS — Z6836 Body mass index (BMI) 36.0-36.9, adult: Secondary | ICD-10-CM

## 2021-03-28 HISTORY — DX: Other allergic rhinitis: J30.89

## 2021-03-28 LAB — RESP PANEL BY RT-PCR (FLU A&B, COVID) ARPGX2
Influenza A by PCR: NEGATIVE
Influenza B by PCR: NEGATIVE
SARS Coronavirus 2 by RT PCR: NEGATIVE

## 2021-03-28 LAB — PREGNANCY, URINE: Preg Test, Ur: NEGATIVE

## 2021-03-28 MED ORDER — ALBUTEROL SULFATE (2.5 MG/3ML) 0.083% IN NEBU
2.5000 mg | INHALATION_SOLUTION | Freq: Once | RESPIRATORY_TRACT | Status: AC
  Administered 2021-03-28: 2.5 mg via RESPIRATORY_TRACT
  Filled 2021-03-28: qty 3

## 2021-03-28 MED ORDER — BENZONATATE 100 MG PO CAPS: 100.0000 mg | ORAL_CAPSULE | Freq: Three times a day (TID) | ORAL | 0 refills | Status: DC

## 2021-03-28 MED ORDER — AZITHROMYCIN 250 MG PO TABS: 250.0000 mg | ORAL_TABLET | Freq: Every day | ORAL | 0 refills | Status: DC

## 2021-03-28 MED ORDER — ALBUTEROL SULFATE (5 MG/ML) 0.5% IN NEBU: 2.5000 mg | INHALATION_SOLUTION | Freq: Four times a day (QID) | RESPIRATORY_TRACT | 12 refills | Status: AC | PRN

## 2021-03-28 MED ORDER — IPRATROPIUM-ALBUTEROL 0.5-2.5 (3) MG/3ML IN SOLN
3.0000 mL | Freq: Once | RESPIRATORY_TRACT | Status: AC
  Administered 2021-03-28: 3 mL via RESPIRATORY_TRACT
  Filled 2021-03-28: qty 3

## 2021-03-28 MED ORDER — ALBUTEROL SULFATE HFA 108 (90 BASE) MCG/ACT IN AERS
2.0000 | INHALATION_SPRAY | RESPIRATORY_TRACT | Status: DC
  Administered 2021-03-28: 2 via RESPIRATORY_TRACT
  Filled 2021-03-28: qty 6.7

## 2021-03-28 MED ORDER — PREDNISONE 50 MG PO TABS: 50.0000 mg | ORAL_TABLET | Freq: Every day | ORAL | 0 refills | Status: AC

## 2021-03-28 MED ORDER — PREDNISONE 50 MG PO TABS
60.0000 mg | ORAL_TABLET | Freq: Once | ORAL | Status: AC
  Administered 2021-03-28: 60 mg via ORAL
  Filled 2021-03-28: qty 1

## 2021-03-28 MED ORDER — ALBUTEROL SULFATE HFA 108 (90 BASE) MCG/ACT IN AERS: 1.0000 | INHALATION_SPRAY | Freq: Four times a day (QID) | RESPIRATORY_TRACT | 0 refills | Status: AC | PRN

## 2021-03-28 NOTE — ED Notes (Signed)
O2 sat 90-92 RA in triage-RT in for assessment

## 2021-03-28 NOTE — ED Triage Notes (Addendum)
Pt c/o cough/SOB started yesterday-NAD-to triage in w/c-pt states she was seen by PCP/dx with allergies and started on symbicort ~1.5 weeks ago-has been using her mother's albuterol inhaler that she feels helps relief sx

## 2021-03-28 NOTE — ED Notes (Signed)
Called to Triage for Resp Compliant.

## 2021-03-28 NOTE — Discharge Instructions (Signed)
Take the medications as prescribed  Follow-up with primary care provider early next week  Return for new or worsening symptoms

## 2021-03-28 NOTE — ED Provider Notes (Signed)
[place] EMERGENCY DEPARTMENT Provider Note   CSN: [id number] Arrival date & time: 03/28/21  1332    History Chief Complaint  Patient presents with   Cough    Elizabeth Palmer is a 25 y.o. female with past medical history significant for eczema who presents for evaluation of cough, shortness of breath.  Was seen by PCP about 2 weeks ago for cough which was thought was due to allergies.  Placed on Symbicort.  Has been using her mother's albuterol inhaler which has helped.  No chest pain.  No lower extremity edema.  No history of PE or DVT.  She also notes she has had some abnormal vaginal bleeding, possible "faint" pregnancy test at home.  Abnormal bleeding started after she was coughing.  No pelvic pain.  No fever, headache, congestion, rhinorrhea, sick contacts.  Denies additional aggravating or relieving factors.  History obtained from patient and past medical records.  No interpreter used  HPI     Past Medical History:  Diagnosis Date   Eczema    Environmental and seasonal allergies     There are no problems to display for this patient.   History reviewed. No pertinent surgical history.   OB History   No obstetric history on file.     No family history on file.  Social History   Tobacco Use   Smoking status: Never   Smokeless tobacco: Never  Vaping Use   Vaping Use: Never used  Substance Use Topics   Alcohol use: Not Currently   Drug use: Yes    Types: Marijuana    Home Medications Prior to Admission medications   Medication Sig Start Date End Date Taking? Authorizing Provider  albuterol (PROVENTIL) (5 MG/ML) 0.5% nebulizer solution Take 0.5 mLs (2.5 mg total) by nebulization every 6 (six) hours as needed for wheezing or shortness of breath. 03/28/21  Yes Sabien Umland A, PA-C  albuterol (VENTOLIN HFA) 108 (90 Base) MCG/ACT inhaler Inhale 1-2 puffs into the lungs every 6 (six) hours as needed for wheezing or shortness of breath. 03/28/21  Yes  Phi Avans A, PA-C  azithromycin (ZITHROMAX) 250 MG tablet Take 1 tablet (250 mg total) by mouth daily. Take first 2 tablets together, then 1 every day until finished. 03/28/21  Yes Jouri Threat A, PA-C  benzonatate (TESSALON) 100 MG capsule Take 1 capsule (100 mg total) by mouth every 8 (eight) hours. 03/28/21  Yes Parish Dubose A, PA-C  predniSONE (DELTASONE) 50 MG tablet Take 1 tablet (50 mg total) by mouth daily for 5 days. 03/28/21 04/02/21 Yes Syesha Thaw A, PA-C  dicyclomine (BENTYL) 20 MG tablet Take 1 tablet (20 mg total) by mouth 2 (two) times daily. 11/20/18   Horton, Barbette Hair, MD  ondansetron (ZOFRAN-ODT) 4 MG disintegrating tablet Take 1 tablet (4 mg total) by mouth every 8 (eight) hours as needed for nausea or vomiting. 05/15/20   Davonna Belling, MD    Allergies    Patient has no known allergies.  Review of Systems   Review of Systems  Constitutional: Negative.   HENT: Negative.    Respiratory:  Positive for cough, shortness of breath and wheezing. Negative for apnea, choking, chest tightness and stridor.   Cardiovascular: Negative.   Gastrointestinal: Negative.   Genitourinary:  Positive for menstrual problem and vaginal bleeding. Negative for decreased urine volume, difficulty urinating, dysuria, flank pain, genital sores, hematuria, pelvic pain, urgency, vaginal discharge and vaginal pain.  Musculoskeletal: Negative.   Skin: Negative.   Neurological:  Negative.   All other systems reviewed and are negative.  Physical Exam Updated Vital Signs BP 134/82 (BP Location: Right Arm)   Pulse 71   Temp 98.6 F (37 C) (Oral)   Resp 15   Ht 5\' 3"  (1.6 m)   Wt 93.9 kg   LMP 03/03/2021   SpO2 97%   BMI 36.67 kg/m   Physical Exam Vitals and nursing note reviewed.  Constitutional:      General: She is not in acute distress.    Appearance: She is well-developed. She is not ill-appearing, toxic-appearing or diaphoretic.  HENT:     Head: Normocephalic and  atraumatic.     Nose: Nose normal.     Mouth/Throat:     Mouth: Mucous membranes are moist.  Eyes:     Pupils: Pupils are equal, round, and reactive to light.  Cardiovascular:     Rate and Rhythm: Normal rate.     Pulses:          Radial pulses are 2+ on the right side and 2+ on the left side.       Dorsalis pedis pulses are 2+ on the right side and 2+ on the left side.       Posterior tibial pulses are 2+ on the right side and 2+ on the left side.     Heart sounds: Normal heart sounds.  Pulmonary:     Effort: No respiratory distress.     Breath sounds: Wheezing present.     Comments: Inspiratory and expiratory wheeze.  No hypoxia in room.  Speaks in full sentences without difficulty Abdominal:     General: Bowel sounds are normal. There is no distension.     Palpations: Abdomen is soft.     Comments: Soft, nontender  Musculoskeletal:        General: Normal range of motion.     Cervical back: Normal range of motion.     Comments: No bony tenderness.  Compartments soft.  Full range of motion.  Homans sign negative  Skin:    General: Skin is warm and dry.     Capillary Refill: Capillary refill takes less than 2 seconds.  Neurological:     General: No focal deficit present.     Mental Status: She is alert and oriented to person, place, and time.  Psychiatric:        Mood and Affect: Mood normal.    ED Results / Procedures / Treatments   Labs (all labs ordered are listed, but only abnormal results are displayed) Labs Reviewed  RESP PANEL BY RT-PCR (FLU A&B, COVID) [id number]  PREGNANCY, URINE    EKG None  Radiology DG Chest Portable 1 View  Result Date: 03/28/2021 CLINICAL DATA:  Cough EXAM: PORTABLE CHEST 1 VIEW COMPARISON:  None. FINDINGS: Heart size and mediastinal contours are within normal limits. No suspicious pulmonary opacities identified. No pleural effusion or pneumothorax visualized. No acute osseous abnormality appreciated. IMPRESSION: No acute intrathoracic  process identified. Electronically Signed   By: Ofilia Neas M.D.   On: 03/28/2021 15:27    Procedures Procedures   Medications Ordered in ED Medications  albuterol (VENTOLIN HFA) 108 (90 Base) MCG/ACT inhaler 2 puff (2 puffs Inhalation Not Given 03/28/21 1555)  ipratropium-albuterol (DUONEB) 0.5-2.5 (3) MG/3ML nebulizer solution 3 mL (3 mLs Nebulization Given 03/28/21 1510)  albuterol (PROVENTIL) (2.5 MG/3ML) 0.083% nebulizer solution 2.5 mg (2.5 mg Nebulization Given 03/28/21 1510)  predniSONE (DELTASONE) tablet 60 mg (60 mg Oral Given 03/28/21 1535)  albuterol (PROVENTIL) (2.5 MG/3ML) 0.083% nebulizer solution 2.5 mg (2.5 mg Nebulization Given 03/28/21 1655)  ipratropium-albuterol (DUONEB) 0.5-2.5 (3) MG/3ML nebulizer solution 3 mL (3 mLs Nebulization Given 03/28/21 1655)   ED Course  I have reviewed the triage vital signs and the nursing notes.  Pertinent labs & imaging results that were available during my care of the patient were reviewed by me and considered in my medical decision making (see chart for details).  Here for evaluation of cough and shortness of breath.  She is afebrile, nonseptic, not ill-appearing.  Has been using mother's albuterol inhaler at home with relief however is not resolving.  Was seen by PCP about 2 weeks ago diagnosed with allergies started on Symbicort.  She is PERC negative, Wells criteria low risk.  She has no other infectious symptoms.  Compartments are soft, no obvious VTE on exam.  Does note she started with some abnormal vaginal bleeding had a possible faint positive on pregnancy test.  We will start with chest x-ray, breathing treatments, pregnancy test and reassess  Work-up personally reviewed and interpreted:  Pregnancy test negative COVID, flu negative Chest x-ray without significant normality  Patient reassessed.  Still has some wheeze however feels significantly improved.  Will give additional DuoNeb.  Question bronchitis as etiology of  patient's symptoms given cough for the last week and a half  Patient reassessed.  Feels significantly better.  Has mild wheeze however appears well.  No tachycardia, tachypnea or hypoxia.  DC home with symptomatic management.  Discussed close follow-up with PCP.  She is agreeable.  The patient has been appropriately medically screened and/or stabilized in the ED. I have low suspicion for any other emergent medical condition which would require further screening, evaluation or treatment in the ED or require inpatient management.  Patient is hemodynamically stable and in no acute distress.  Patient able to ambulate in department prior to ED.  Evaluation does not show acute pathology that would require ongoing or additional emergent interventions while in the emergency department or further inpatient treatment.  I have discussed the diagnosis with the patient and answered all questions.  Pain is been managed while in the emergency department and patient has no further complaints prior to discharge.  Patient is comfortable with plan discussed in room and is stable for discharge at this time.  I have discussed strict return precautions for returning to the emergency department.  Patient was encouraged to follow-up with PCP/specialist refer to at discharge.      MDM Rules/Calculators/A&P                            Final Clinical Impression(s) / ED Diagnoses Final diagnoses:  Subacute cough  Wheeze    Rx / DC Orders ED Discharge Orders          Ordered    albuterol (VENTOLIN HFA) 108 (90 Base) MCG/ACT inhaler  Every 6 hours PRN        03/28/21 1757    benzonatate (TESSALON) 100 MG capsule  Every 8 hours        03/28/21 1757    azithromycin (ZITHROMAX) 250 MG tablet  Daily        03/28/21 1757    albuterol (PROVENTIL) (5 MG/ML) 0.5% nebulizer solution  Every 6 hours PRN        03/28/21 1757    For home use only DME Nebulizer machine        03/28/21 1757  predniSONE (DELTASONE) 50 MG  tablet  Daily        03/28/21 1757             Jameire Kouba A, PA-C 03/28/21 1759    Davonna Belling, MD 03/29/21 [phone]

## 2021-03-28 NOTE — ED Triage Notes (Addendum)
C/o cont SOB , seen here earlier today  for same , RT called to triage, O2 sat in triage 88 %

## 2021-03-29 ENCOUNTER — Encounter (HOSPITAL_COMMUNITY): Payer: Self-pay | Admitting: Family Medicine

## 2021-03-29 DIAGNOSIS — N939 Abnormal uterine and vaginal bleeding, unspecified: Secondary | ICD-10-CM | POA: Diagnosis not present

## 2021-03-29 DIAGNOSIS — Z20822 Contact with and (suspected) exposure to covid-19: Secondary | ICD-10-CM | POA: Diagnosis not present

## 2021-03-29 DIAGNOSIS — Z6836 Body mass index (BMI) 36.0-36.9, adult: Secondary | ICD-10-CM

## 2021-03-29 DIAGNOSIS — R0602 Shortness of breath: Secondary | ICD-10-CM | POA: Diagnosis present

## 2021-03-29 DIAGNOSIS — Z7951 Long term (current) use of inhaled steroids: Secondary | ICD-10-CM | POA: Diagnosis not present

## 2021-03-29 DIAGNOSIS — F1220 Cannabis dependence, uncomplicated: Secondary | ICD-10-CM | POA: Diagnosis present

## 2021-03-29 DIAGNOSIS — E6609 Other obesity due to excess calories: Secondary | ICD-10-CM | POA: Diagnosis not present

## 2021-03-29 DIAGNOSIS — R0600 Dyspnea, unspecified: Secondary | ICD-10-CM

## 2021-03-29 LAB — BASIC METABOLIC PANEL
Anion gap: 12 (ref 5–15)
BUN: 10 mg/dL (ref 6–20)
CO2: 21 mmol/L — ABNORMAL LOW (ref 22–32)
Calcium: 10 mg/dL (ref 8.9–10.3)
Chloride: 104 mmol/L (ref 98–111)
Creatinine, Ser: 0.83 mg/dL (ref 0.44–1.00)
GFR, Estimated: >60 mL/min (ref >60)
Glucose, Bld: 142 mg/dL — ABNORMAL HIGH (ref 70–99)
Potassium: 5.1 mmol/L (ref 3.5–5.1)
Sodium: 137 mmol/L (ref 135–145)

## 2021-03-29 LAB — RAPID URINE DRUG SCREEN, HOSP PERFORMED
Amphetamines: NOT DETECTED
Barbiturates: NOT DETECTED
Benzodiazepines: NOT DETECTED
Cocaine: NOT DETECTED
Opiates: NOT DETECTED
Tetrahydrocannabinol: POSITIVE — AB

## 2021-03-29 LAB — CBC WITH DIFFERENTIAL/PLATELET
Abs Immature Granulocytes: 0.06 10*3/uL (ref 0.00–0.07)
Basophils Absolute: 0 10*3/uL (ref 0.0–0.1)
Basophils Relative: 0 %
Eosinophils Absolute: 0 10*3/uL (ref 0.0–0.5)
Eosinophils Relative: 0 %
HCT: 44.8 % (ref 36.0–46.0)
Hemoglobin: 15.7 g/dL — ABNORMAL HIGH (ref 12.0–15.0)
Immature Granulocytes: 0 %
Lymphocytes Relative: 12 %
Lymphs Abs: 1.6 10*3/uL (ref 0.7–4.0)
MCH: 31.1 pg (ref 26.0–34.0)
MCHC: 35 g/dL (ref 30.0–36.0)
MCV: 88.7 fL (ref 80.0–100.0)
Monocytes Absolute: 0.5 10*3/uL (ref 0.1–1.0)
Monocytes Relative: 4 %
Neutro Abs: 11.3 10*3/uL — ABNORMAL HIGH (ref 1.7–7.7)
Neutrophils Relative %: 84 %
Platelets: 245 10*3/uL (ref 150–400)
RBC: 5.05 MIL/uL (ref 3.87–5.11)
RDW: 13.3 % (ref 11.5–15.5)
WBC: 13.4 10*3/uL — ABNORMAL HIGH (ref 4.0–10.5)
nRBC: 0 % (ref 0.0–0.2)

## 2021-03-29 LAB — RESPIRATORY PANEL BY PCR

## 2021-03-29 LAB — TROPONIN I (HIGH SENSITIVITY): Troponin I (High Sensitivity): <2 ng/L (ref <18)

## 2021-03-29 LAB — RESP PANEL BY RT-PCR (FLU A&B, COVID) ARPGX2
Influenza A by PCR: NEGATIVE
Influenza B by PCR: NEGATIVE
SARS Coronavirus 2 by RT PCR: NEGATIVE

## 2021-03-29 LAB — HIV ANTIBODY (ROUTINE TESTING W REFLEX): HIV Screen 4th Generation wRfx: NONREACTIVE

## 2021-03-29 LAB — BRAIN NATRIURETIC PEPTIDE: B Natriuretic Peptide: 34.3 pg/mL (ref 0.0–100.0)

## 2021-03-29 LAB — D-DIMER, QUANTITATIVE: D-Dimer, Quant: 0.29 ug/mL-FEU (ref 0.00–0.50)

## 2021-03-29 LAB — HCG, SERUM, QUALITATIVE: Preg, Serum: NEGATIVE

## 2021-03-29 MED ORDER — ALBUTEROL (5 MG/ML) CONTINUOUS INHALATION SOLN
10.0000 mg/h | INHALATION_SOLUTION | RESPIRATORY_TRACT | Status: AC
Start: 2021-03-29 — End: 2021-03-29
  Administered 2021-03-29: 10 mg/h via RESPIRATORY_TRACT

## 2021-03-29 MED ORDER — PREDNISONE 20 MG PO TABS: 40.0000 mg | ORAL_TABLET | Freq: Every day | ORAL | Status: DC

## 2021-03-29 MED ORDER — MAGNESIUM SULFATE 50 % IJ SOLN
2.0000 g | Freq: Once | INTRAMUSCULAR | Status: AC
  Administered 2021-03-29: 2 g via INTRAVENOUS
  Filled 2021-03-29: qty 4

## 2021-03-29 MED ORDER — ONDANSETRON HCL 4 MG/2ML IJ SOLN: 4.0000 mg | Freq: Four times a day (QID) | INTRAMUSCULAR | Status: DC | PRN

## 2021-03-29 MED ORDER — ACETAMINOPHEN 325 MG PO TABS
650.0000 mg | ORAL_TABLET | Freq: Four times a day (QID) | ORAL | Status: DC | PRN
  Administered 2021-03-29: 650 mg via ORAL
  Filled 2021-03-29: qty 2

## 2021-03-29 MED ORDER — KETOROLAC TROMETHAMINE 30 MG/ML IJ SOLN
30.0000 mg | Freq: Four times a day (QID) | INTRAMUSCULAR | Status: DC | PRN
  Administered 2021-03-29: 30 mg via INTRAVENOUS
  Filled 2021-03-29: qty 1

## 2021-03-29 MED ORDER — GUAIFENESIN ER 600 MG PO TB12
600.0000 mg | ORAL_TABLET | Freq: Two times a day (BID) | ORAL | Status: DC | PRN
  Administered 2021-03-29: 600 mg via ORAL
  Filled 2021-03-29: qty 1

## 2021-03-29 MED ORDER — METHYLPREDNISOLONE SODIUM SUCC 125 MG IJ SOLR
60.0000 mg | Freq: Two times a day (BID) | INTRAMUSCULAR | Status: DC
  Administered 2021-03-29: 60 mg via INTRAVENOUS
  Filled 2021-03-29: qty 2

## 2021-03-29 MED ORDER — KETOROLAC TROMETHAMINE 30 MG/ML IJ SOLN
30.0000 mg | Freq: Once | INTRAMUSCULAR | Status: AC
  Administered 2021-03-29: 30 mg via INTRAVENOUS
  Filled 2021-03-29: qty 1

## 2021-03-29 MED ORDER — ALBUTEROL SULFATE (2.5 MG/3ML) 0.083% IN NEBU
5.0000 mg | INHALATION_SOLUTION | Freq: Once | RESPIRATORY_TRACT | Status: AC
  Administered 2021-03-29: 5 mg via RESPIRATORY_TRACT
  Filled 2021-03-29: qty 6

## 2021-03-29 MED ORDER — DOCUSATE SODIUM 100 MG PO CAPS
100.0000 mg | ORAL_CAPSULE | Freq: Two times a day (BID) | ORAL | Status: DC
  Administered 2021-03-29 (×2): 100 mg via ORAL
  Filled 2021-03-29 (×2): qty 1

## 2021-03-29 MED ORDER — BISACODYL 5 MG PO TBEC: 5.0000 mg | DELAYED_RELEASE_TABLET | Freq: Every day | ORAL | Status: DC | PRN

## 2021-03-29 MED ORDER — DICYCLOMINE HCL 20 MG PO TABS
20.0000 mg | ORAL_TABLET | Freq: Two times a day (BID) | ORAL | Status: DC
  Administered 2021-03-29: 20 mg via ORAL
  Filled 2021-03-29 (×2): qty 1

## 2021-03-29 MED ORDER — ALBUTEROL SULFATE (2.5 MG/3ML) 0.083% IN NEBU
2.5000 mg | INHALATION_SOLUTION | RESPIRATORY_TRACT | Status: DC | PRN
  Administered 2021-03-29: 2.5 mg via RESPIRATORY_TRACT
  Filled 2021-03-29: qty 3

## 2021-03-29 MED ORDER — IPRATROPIUM-ALBUTEROL 0.5-2.5 (3) MG/3ML IN SOLN
3.0000 mL | Freq: Four times a day (QID) | RESPIRATORY_TRACT | Status: DC
  Administered 2021-03-29: 3 mL via RESPIRATORY_TRACT
  Filled 2021-03-29: qty 3

## 2021-03-29 MED ORDER — ONDANSETRON HCL 4 MG PO TABS: 4.0000 mg | ORAL_TABLET | Freq: Four times a day (QID) | ORAL | Status: DC | PRN

## 2021-03-29 MED ORDER — ENOXAPARIN SODIUM 40 MG/0.4ML IJ SOSY
40.0000 mg | PREFILLED_SYRINGE | INTRAMUSCULAR | Status: DC
  Administered 2021-03-29: 40 mg via SUBCUTANEOUS
  Filled 2021-03-29: qty 0.4

## 2021-03-29 MED ORDER — POLYETHYLENE GLYCOL 3350 17 G PO PACK: 17.0000 g | PACK | Freq: Every day | ORAL | Status: DC | PRN

## 2021-03-29 MED ORDER — METHOCARBAMOL 1000 MG/10ML IJ SOLN
500.0000 mg | Freq: Four times a day (QID) | INTRAVENOUS | Status: DC | PRN
  Filled 2021-03-29: qty 5

## 2021-03-29 MED ORDER — LACTATED RINGERS IV SOLN: INTRAVENOUS | Status: DC

## 2021-03-29 MED ORDER — METHYLPREDNISOLONE SODIUM SUCC 125 MG IJ SOLR
80.0000 mg | Freq: Once | INTRAMUSCULAR | Status: AC
  Administered 2021-03-29: 80 mg via INTRAVENOUS
  Filled 2021-03-29: qty 2

## 2021-03-29 MED ORDER — HYDRALAZINE HCL 20 MG/ML IJ SOLN: 5.0000 mg | INTRAMUSCULAR | Status: DC | PRN

## 2021-03-29 MED ORDER — CYCLOBENZAPRINE HCL 10 MG PO TABS
10.0000 mg | ORAL_TABLET | Freq: Once | ORAL | Status: AC
  Administered 2021-03-29: 10 mg via ORAL
  Filled 2021-03-29: qty 1

## 2021-03-29 MED ORDER — ACETAMINOPHEN 650 MG RE SUPP: 650.0000 mg | Freq: Four times a day (QID) | RECTAL | Status: DC | PRN

## 2021-03-29 NOTE — ED Notes (Addendum)
Nasal Cannula was placed on pt. 2L

## 2021-03-29 NOTE — ED Notes (Signed)
[facility] at bedside 

## 2021-03-29 NOTE — ED Notes (Signed)
Ambulated patient. Resting oxygen saturation 93%, HR 128/RR 24. Patient walked slowly with increased WOB and decreased oxygen saturation to 89%. Patient was taken back to room and placed back on monitor. Physician notified.

## 2021-03-29 NOTE — ED Notes (Signed)
Ambulated pt down the hallway, started off at 91% with a initial HR of 108-118, then started walking down hallway and felt good.  Walked about 100 feet, then started to get winded, SpO2 maintained 88-92% with a HR of 117-120.  I asked if she was able to proceed, she said "she could continue", so walked another 100 feet, then upon returning to room SpO2 was 88-91 with HR 130's and felt that she needed oxygen again, so placed pt on 2L at that time.  After sitting up on side of bed, felt better after a min or two, and SpO2 returned to 96%. Pt. Has explained that she feels " a whole lot better and has communicated their desires to go home, but only if its safe for pt to do so".  Relayed all information to EDP.

## 2021-03-29 NOTE — ED Provider Notes (Signed)
[facility] EMERGENCY DEPARTMENT Provider Note   CSN: [id number] Arrival date & time: 03/28/21  2317     History Chief Complaint  Patient presents with   Shortness of Breath    Elizabeth Palmer is a 25 y.o. female.  Patient is a 25 year old female with past medical history of eczema, but no history of asthma.  She returns for evaluation of wheezing and shortness of breath.  This has been worsening over the past 2 days.  She was given oral steroids and albuterol here in the ER with some improvement.  Shortly after returning home, her wheezing worsened and her inhaler was not helping.  She returns for reevaluation.  She denies any chest pain, fevers, chills, or productive cough.  The history is provided by the patient.  Shortness of Breath Severity:  Moderate Onset quality:  Gradual Duration:  2 days Timing:  Constant Progression:  Worsening Chronicity:  New Relieved by:  Nothing Worsened by:  Nothing     Past Medical History:  Diagnosis Date   Eczema    Environmental and seasonal allergies     There are no problems to display for this patient.   History reviewed. No pertinent surgical history.   OB History   No obstetric history on file.     No family history on file.  Social History   Tobacco Use   Smoking status: Never   Smokeless tobacco: Never  Vaping Use   Vaping Use: Never used  Substance Use Topics   Alcohol use: Not Currently   Drug use: Yes    Types: Marijuana    Home Medications Prior to Admission medications   Medication Sig Start Date End Date Taking? Authorizing Provider  albuterol (PROVENTIL) (5 MG/ML) 0.5% nebulizer solution Take 0.5 mLs (2.5 mg total) by nebulization every 6 (six) hours as needed for wheezing or shortness of breath. 03/28/21   Henderly, Britni A, PA-C  albuterol (VENTOLIN HFA) 108 (90 Base) MCG/ACT inhaler Inhale 1-2 puffs into the lungs every 6 (six) hours as needed for wheezing or shortness of breath. 03/28/21    Henderly, Britni A, PA-C  azithromycin (ZITHROMAX) 250 MG tablet Take 1 tablet (250 mg total) by mouth daily. Take first 2 tablets together, then 1 every day until finished. 03/28/21   Henderly, Britni A, PA-C  benzonatate (TESSALON) 100 MG capsule Take 1 capsule (100 mg total) by mouth every 8 (eight) hours. 03/28/21   Henderly, Britni A, PA-C  dicyclomine (BENTYL) 20 MG tablet Take 1 tablet (20 mg total) by mouth 2 (two) times daily. 11/20/18   Horton, Mayer Masker, MD  ondansetron (ZOFRAN-ODT) 4 MG disintegrating tablet Take 1 tablet (4 mg total) by mouth every 8 (eight) hours as needed for nausea or vomiting. 05/15/20   Benjiman Core, MD  predniSONE (DELTASONE) 50 MG tablet Take 1 tablet (50 mg total) by mouth daily for 5 days. 03/28/21 04/02/21  Henderly, Britni A, PA-C    Allergies    Patient has no known allergies.  Review of Systems   Review of Systems  Respiratory:  Positive for shortness of breath.   All other systems reviewed and are negative.  Physical Exam Updated Vital Signs BP 131/83 (BP Location: Right Arm)   Pulse (!) 103   Temp 98.3 F (36.8 C) (Oral)   Resp 16   LMP 03/03/2021   SpO2 95%   Physical Exam Vitals and nursing note reviewed.  Constitutional:      General: She is not in acute  distress.    Appearance: She is well-developed. She is not diaphoretic.  HENT:     Head: Normocephalic and atraumatic.  Cardiovascular:     Rate and Rhythm: Normal rate and regular rhythm.     Heart sounds: No murmur heard.   No friction rub. No gallop.  Pulmonary:     Effort: Pulmonary effort is normal. No respiratory distress.     Breath sounds: Examination of the right-middle field reveals wheezing. Examination of the left-middle field reveals wheezing. Wheezing present.     Comments: Patient with expiratory wheezes bilaterally with somewhat decreased air movement. Abdominal:     General: Bowel sounds are normal. There is no distension.     Palpations: Abdomen is soft.      Tenderness: There is no abdominal tenderness.  Musculoskeletal:        General: Normal range of motion.     Cervical back: Normal range of motion and neck supple.     Right lower leg: No tenderness. No edema.     Left lower leg: No tenderness. No edema.  Skin:    General: Skin is warm and dry.  Neurological:     General: No focal deficit present.     Mental Status: She is alert and oriented to person, place, and time.    ED Results / Procedures / Treatments   Labs (all labs ordered are listed, but only abnormal results are displayed) Labs Reviewed  BASIC METABOLIC PANEL  CBC WITH DIFFERENTIAL/PLATELET  HCG, SERUM, QUALITATIVE    EKG EKG Interpretation  Date/Time:  Saturday March 29 2021 00:46:00 EST Ventricular Rate:  116 PR Interval:  141 QRS Duration: 85 QT Interval:  322 QTC Calculation: 448 R Axis:   84 Text Interpretation: Sinus tachycardia Right atrial enlargement Borderline repolarization abnormality Confirmed by Geoffery Lyons ([id number]) on 03/29/2021 2:46:44 AM  Radiology DG Chest Portable 1 View  Result Date: 03/28/2021 CLINICAL DATA:  Cough EXAM: PORTABLE CHEST 1 VIEW COMPARISON:  None. FINDINGS: Heart size and mediastinal contours are within normal limits. No suspicious pulmonary opacities identified. No pleural effusion or pneumothorax visualized. No acute osseous abnormality appreciated. IMPRESSION: No acute intrathoracic process identified. Electronically Signed   By: Jannifer Hick M.D.   On: 03/28/2021 15:27    Procedures Procedures   Medications Ordered in ED Medications  albuterol (PROVENTIL,VENTOLIN) solution continuous neb (has no administration in time range)  magnesium sulfate (IV Push/IM) injection 2 g (has no administration in time range)  methylPREDNISolone sodium succinate (SOLU-MEDROL) 125 mg/2 mL injection 80 mg (has no administration in time range)  ipratropium-albuterol (DUONEB) 0.5-2.5 (3) MG/3ML nebulizer solution 3 mL (3 mLs  Nebulization Given 03/28/21 2355)  albuterol (PROVENTIL) (2.5 MG/3ML) 0.083% nebulizer solution 2.5 mg (2.5 mg Nebulization Given 03/28/21 2355)    ED Course  I have reviewed the triage vital signs and the nursing notes.  Pertinent labs & imaging results that were available during my care of the patient were reviewed by me and considered in my medical decision making (see chart for details).    MDM Rules/Calculators/A&P  Patient is a 25 year old female with history of eczema presenting with complaints of shortness of breath.  This has been worsening over the past 2 days.  She was seen earlier this afternoon, then discharged and returns with worsening symptoms.  Upon arrival, patient's oxygen saturations were in the upper 80s with tachypnea and wheezing throughout her lungs.  She was started on an hour-long neb along with IV steroids, IV magnesium, but  continued with saturations dropping into the upper 80s while off of oxygen.  Additional work-up entertained including D-dimer which was negative, EKG and troponin which were also negative, and BNP.  Chest x-ray from earlier today reveals no acute process.  And attempted ambulation was made, however saturations dropped into the 80s and she became tachypneic with a respiratory rate of 40.  At this point, I feel as though patient will require admission for additional steroids and breathing treatments, and possible further diagnosis into the cause of her wheezing and dyspnea.  I have spoken with Dr. Rachael Darby who agrees to admit.  CRITICAL CARE Performed by: Geoffery Lyons Total critical care time: 45 minutes Critical care time was exclusive of separately billable procedures and treating other patients. Critical care was necessary to treat or prevent imminent or life-threatening deterioration. Critical care was time spent personally by me on the following activities: development of treatment plan with patient and/or surrogate as well as nursing,  discussions with consultants, evaluation of patient's response to treatment, examination of patient, obtaining history from patient or surrogate, ordering and performing treatments and interventions, ordering and review of laboratory studies, ordering and review of radiographic studies, pulse oximetry and re-evaluation of patient's condition.   Final Clinical Impression(s) / ED Diagnoses Final diagnoses:  None    Rx / DC Orders ED Discharge Orders     None        Geoffery Lyons, MD 03/29/21 [id number]

## 2021-03-29 NOTE — H&P (Signed)
History and Physical    Elizabeth Palmer DXI:[id number] DOB: 1995/10/06 DOA: 03/28/2021  PCP: [facility] Consultants:  Evans Lance - OB/GYN; Doran Heater - ENT Patient coming from:  Home - lives with mother; JQB:HALPFX, [phone], Elizabeth Palmer   Chief Complaint: SOB, wheezing  HPI: Elizabeth Palmer is a 25 y.o. female with medical history significant of DUB presenting with SOB, wheezing.   It started Thursday night and she couldn't breathe well, felt like someone was sitting on her chest and she was panting and gasping.  Then she started having cramps in her ribs, back, abdomen.   She hasn't been able to eat/drink.  Drinking seems to take her breath away.  She is not usually on oxygen.  She was given many albuterol treatments.  The treatments work well but then she is panting again.  The thing that seemed to help was a muscle relaxer and pain medication in her IV this AM about 430.  This was the first time since Thursday that she has felt a little better and rested.  The cramps started again when she was awakened again.  Magnesium seemed to help for about 45 minutes.  She has been wheezing but that is better.  She has been coughing, nonproductive until early this AM and then it was clear green in color.  No fevers.  No sick contacts.  She has never had medical problems or had asthma but she does have eczema.They had gone to the ER, was released about 630, went to get prescriptions and by the time she got home she was panting again.  They decided to go back to the ER again.    ED Course: [facility] to [facility] transfer, per Dr. Rachael Darby:  25 yo female who presented to [facility] with wheezing and SOB. Given steroids and breathing treatment and sent home. Returned a few hours later with wheezing dyspnea again. No history of asthma. Has had magnesium, multiple breathing treatments and continues to have wheezing and hypoxemia. Accepted to [facility] med surg bed.  Testing for RSV as etiology of  symptoms. Covid and flu negative  Review of Systems: As per HPI; otherwise review of systems reviewed and negative.   Ambulatory Status:  Ambulates without assistance  COVID Vaccine Status:   First shot  Past Medical History:  Diagnosis Date   Eczema    Environmental and seasonal allergies     History reviewed. No pertinent surgical history.  Social History   Socioeconomic History   Marital status: Single    Spouse name: Not on file   Number of children: Not on file   Years of education: Not on file   Highest education level: Not on file  Occupational History   Occupation: unemployed  Tobacco Use   Smoking status: Never   Smokeless tobacco: Never  Vaping Use   Vaping Use: Never used  Substance and Sexual Activity   Alcohol use: Yes    Comment: rare   Drug use: Yes    Types: Marijuana    Comment: daily use for 3 years, quit 1 week ago   Sexual activity: Not on file  Other Topics Concern   Not on file  Social History Narrative   Not on file   Social Determinants of Health   Financial Resource Strain: Not on file  Food Insecurity: Not on file  Transportation Needs: Not on file  Physical Activity: Not on file  Stress: Not on file  Social Connections: Not on file  Intimate Partner  Violence: Not on file    No Known Allergies  Family History  Problem Relation Age of Onset   COPD Maternal Grandmother     Prior to Admission medications   Medication Sig Start Date End Date Taking? Authorizing Provider  albuterol (PROVENTIL) (5 MG/ML) 0.5% nebulizer solution Take 0.5 mLs (2.5 mg total) by nebulization every 6 (six) hours as needed for wheezing or shortness of breath. 03/28/21   Henderly, Britni A, PA-C  albuterol (VENTOLIN HFA) 108 (90 Base) MCG/ACT inhaler Inhale 1-2 puffs into the lungs every 6 (six) hours as needed for wheezing or shortness of breath. 03/28/21   Henderly, Britni A, PA-C  azithromycin (ZITHROMAX) 250 MG tablet Take 1 tablet (250 mg total) by  mouth daily. Take first 2 tablets together, then 1 every day until finished. 03/28/21   Henderly, Britni A, PA-C  benzonatate (TESSALON) 100 MG capsule Take 1 capsule (100 mg total) by mouth every 8 (eight) hours. 03/28/21   Henderly, Britni A, PA-C  dicyclomine (BENTYL) 20 MG tablet Take 1 tablet (20 mg total) by mouth 2 (two) times daily. 11/20/18   Horton, Mayer Masker, MD  ondansetron (ZOFRAN-ODT) 4 MG disintegrating tablet Take 1 tablet (4 mg total) by mouth every 8 (eight) hours as needed for nausea or vomiting. 05/15/20   Benjiman Core, MD  predniSONE (DELTASONE) 50 MG tablet Take 1 tablet (50 mg total) by mouth daily for 5 days. 03/28/21 04/02/21  Henderly, Britni A, PA-C    Physical Exam: Vitals:   03/29/21 0730 03/29/21 0830 03/29/21 0933 03/29/21 1034  BP: 122/85 (!) 154/98 129/77 (!) 134/99  Pulse: 93 98 (!) 104 89  Resp: (!) 21 18 (!) 29 20  Temp:   98.3 F (36.8 C) 98.4 F (36.9 C)  TempSrc:   Oral Oral  SpO2: 94% 91% 94% 96%  Weight:      Height:         General:  Appears calm and comfortable and is in NAD Eyes:   EOMI, normal lids, iris ENT:  grossly normal hearing, lips & tongue, mmm; appropriate dentition Neck:  no LAD, masses or thyromegaly Cardiovascular:  RRR, no m/r/g. No LE edema.  Respiratory:   CTA bilaterally with scant scattered wheezing.  Normal to mildly increased respiratory effort on [place] O2. Abdomen:  soft, NT, ND; mild abdominal muscle wall TTP Skin:  no rash or induration seen on limited exam Musculoskeletal:  grossly normal tone BUE/BLE, good ROM, no bony abnormality Psychiatric:  grossly normal mood and affect, speech fluent and appropriate, AOx3 Neurologic:  CN 2-12 grossly intact, moves all extremities in coordinated fashion    Radiological Exams on Admission: Independently reviewed - see discussion in A/P where applicable  DG Chest Portable 1 View  Result Date: 03/28/2021 CLINICAL DATA:  Cough EXAM: PORTABLE CHEST 1 VIEW COMPARISON:  None.  FINDINGS: Heart size and mediastinal contours are within normal limits. No suspicious pulmonary opacities identified. No pleural effusion or pneumothorax visualized. No acute osseous abnormality appreciated. IMPRESSION: No acute intrathoracic process identified. Electronically Signed   By: Jannifer Hick M.D.   On: 03/28/2021 15:27    EKG: Independently reviewed.  Sinus tachycardia with rate 116; nonspecific ST changes that are likely rate-related   Labs on Admission: I have personally reviewed the available labs and imaging studies at the time of the admission.  Pertinent labs:   Glucose 142 BNP 34.3 HS troponin <2 WBC 13.4 COVID/flu negative Upreg negative   Assessment/Plan Principal Problem:   Dyspnea  Active Problems:   Marijuana dependence (HCC)   Class 2 obesity due to excess calories with body mass index (BMI) of 36.0 to 36.9 in adult    SOB -Patient with h/o eczema/allergies but without prior wheezing episodes presenting with subacute wheezing, cough, SOB -She was seen and discharged from the ER and returned a few hours later with rebound symptoms -This could be new-onset asthma but is at least as likely to be WARI -She currently feels better and appears very stable -Will observe overnight -Attempt to wean off [place] O2 -There does not appear to be an indication for antibiotics at this time -Obesity, chronic marijuana use are also probable factors. -CXR negative for PNA; no other apparent viral infection; elevated WBC is likely related to acute stress response and steroid exposure. -Nebulizers: prn albuterol  -Solu-Medrol 60 mg IV BID - > prednisone -Received magnesium in the ER as well as continuous neb  -She has experienced muscular cramping; will treat with Toradol and Robaxin prn -Mucinex for cough  Obesity -Body mass index is 36.67 kg/m..  -Weight loss should be encouraged -Outpatient PCP/bariatric medicine/bariatric surgery f/u encouraged  -Nutrition consult  requested  Marijuana dependence -Cessation encouraged; this should be encouraged on an ongoing basis.  She quit last week. -UDS ordered      Note: This patient has been tested and is negative for the novel coronavirus COVID-19. The patient has been partially vaccinated against COVID-19.   Level of care: Med-Surg DVT prophylaxis:  Lovenox  Code Status:  Full - confirmed with patient/family Family Communication: Mother was present throughout evaluation. Disposition Plan:  The patient is from: home  Anticipated d/c is to: home without [facility] services   Anticipated d/c date will depend on clinical response to treatment, but possibly as early as tomorrow if she has excellent response to treatment  Patient is currently: acutely ill Consults called: Nutrition  Admission status:  It is my clinical opinion that referral for OBSERVATION is reasonable and necessary in this patient based on the above information provided. The aforementioned taken together are felt to place the patient at high risk for further clinical deterioration. However it is anticipated that the patient may be medically stable for discharge from the hospital within 24 to 48 hours.    Jonah Blue MD [facility]   How to contact the [facility] Attending or Consulting provider 7A - 7P or covering provider during after hours 7P -7A, for this patient?  Check the care team in [facility] and look for a) attending/consulting [facility] provider listed and b) the [facility] team listed Log into [email or web address] and use [place]'s universal password to access. If you do not have the password, please contact the hospital operator. Locate the [facility] provider you are looking for under [facility] and page to a number that you can be directly reached. If you still have difficulty reaching the provider, please page the [facility] (Director on Call) for the Hospitalists listed on amion for assistance.   03/29/2021, 1:01 PM

## 2021-03-30 NOTE — Discharge Summary (Addendum)
Physician Discharge Summary   Patient name: Elizabeth Palmer  Admit date:     03/28/2021  Discharge date: 03/29/2021  Discharge Physician: Jonah Blue   PCP: [facility]   Recommendations at discharge: f/u with PCP  PATIENT LEFT AGAINST MEDICAL ADVICE  Discharge Diagnoses Principal Problem:   Dyspnea Active Problems:   Marijuana dependence (HCC)   Class 2 obesity due to excess calories with body mass index (BMI) of 36.0 to 36.9 in adult   Resolved Diagnoses Resolved Problems:   * No resolved hospital problems. *   Hospital Course   SOB -Patient with h/o eczema/allergies but without prior wheezing episodes presented with subacute wheezing, cough, SOB -She was seen and discharged from the ER and returned a few hours later with rebound symptoms -This could be new-onset asthma but is at least as likely to be WARI -Patient was transferred from [facility] to [facility] for overnight observation -She was able to transition to room air in the evening -There does not appear to be an indication for antibiotics at this time -Obesity, chronic marijuana use are also probable factors. -CXR negative for PNA; no other apparent viral infection; elevated WBC is likely related to acute stress response and steroid exposure. -Nebulizers: prn albuterol  -Solu-Medrol 60 mg IV BID - > prednisone -Received magnesium in the ER as well as continuous neb  -She has experienced muscular cramping; ordered treatment with Toradol and Robaxin prn -Mucinex for cough -Patient opted to leave AMA overnight; she has prescriptions for Azithromycin and prednisone at home and can take those as directed -Viral panel was negative; if symptoms recur, asthma is a consideration   Obesity -Body mass index is 36.67 kg/m..  -Weight loss should be encouraged -Outpatient PCP/bariatric medicine/bariatric surgery f/u encouraged  -Nutrition consult requested but patient left prior to consult    Marijuana dependence -Cessation encouraged; this should be encouraged on an ongoing basis.  She quit last week per report. -UDS positive only for THC     Procedures performed: None   Condition at discharge:  Left AMA  Exam Physical Exam left AMA  Disposition: Home  Discharge time: less than 30 minutes.   Allergies as of 03/29/2021   No Known Allergies      Medication List     ASK your doctor about these medications    albuterol 108 (90 Base) MCG/ACT inhaler Commonly known as: VENTOLIN HFA Inhale 1-2 puffs into the lungs every 6 (six) hours as needed for wheezing or shortness of breath.   albuterol (5 MG/ML) 0.5% nebulizer solution Commonly known as: PROVENTIL Take 0.5 mLs (2.5 mg total) by nebulization every 6 (six) hours as needed for wheezing or shortness of breath.   azithromycin 250 MG tablet Commonly known as: ZITHROMAX Take 1 tablet (250 mg total) by mouth daily. Take first 2 tablets together, then 1 every day until finished.   benzonatate 100 MG capsule Commonly known as: TESSALON Take 1 capsule (100 mg total) by mouth every 8 (eight) hours.   budesonide-formoterol 80-4.5 MCG/ACT inhaler Commonly known as: SYMBICORT Inhale 2 puffs into the lungs 2 (two) times daily.   citalopram 20 MG tablet Commonly known as: CELEXA Take 20 mg by mouth every morning.   dicyclomine 20 MG tablet Commonly known as: BENTYL Take 1 tablet (20 mg total) by mouth 2 (two) times daily.   fluticasone 50 MCG/ACT nasal spray Commonly known as: FLONASE Place 1 spray into both nostrils daily as needed for allergies or rhinitis.  hydrocortisone 2.5 % cream Apply 1 application topically 3 (three) times daily as needed (facial eczema). Use sparingly   polyethylene glycol 17 g packet Commonly known as: MIRALAX / GLYCOLAX Take 17 g by mouth daily as needed (constipation).   predniSONE 50 MG tablet Commonly known as: DELTASONE Take 1 tablet (50 mg total) by mouth daily for 5  days.        DG Chest Portable 1 View  Result Date: 03/28/2021 CLINICAL DATA:  Cough EXAM: PORTABLE CHEST 1 VIEW COMPARISON:  None. FINDINGS: Heart size and mediastinal contours are within normal limits. No suspicious pulmonary opacities identified. No pleural effusion or pneumothorax visualized. No acute osseous abnormality appreciated. IMPRESSION: No acute intrathoracic process identified. Electronically Signed   By: Jannifer Hick M.D.   On: 03/28/2021 15:27   Results for orders placed or performed during the hospital encounter of 03/28/21  Resp Panel by RT-PCR (Flu A&B, Covid) Nasopharyngeal Swab     Status: None   Collection Time: 03/29/21  3:01 AM   Specimen: Nasopharyngeal Swab; Nasopharyngeal(NP) swabs in vial transport medium  Result Value Ref Range Status   SARS Coronavirus 2 by RT PCR NEGATIVE NEGATIVE Final    Comment: (NOTE) SARS-CoV-2 target nucleic acids are NOT DETECTED.  The SARS-CoV-2 RNA is generally detectable in upper respiratory specimens during the acute phase of infection. The lowest concentration of SARS-CoV-2 viral copies this assay can detect is 138 copies/mL. A negative result does not preclude SARS-Cov-2 infection and should not be used as the sole basis for treatment or other patient management decisions. A negative result may occur with  improper specimen collection/handling, submission of specimen other than nasopharyngeal swab, presence of viral mutation(s) within the areas targeted by this assay, and inadequate number of viral copies(<138 copies/mL). A negative result must be combined with clinical observations, patient history, and epidemiological information. The expected result is Negative.  Fact Sheet for Patients:  BloggerCourse.com  Fact Sheet for Healthcare Providers:  SeriousBroker.it  This test is no t yet approved or cleared by the [place] FDA and  has been authorized for  detection and/or diagnosis of SARS-CoV-2 by FDA under an Emergency Use Authorization (EUA). This EUA will remain  in effect (meaning this test can be used) for the duration of the COVID-19 declaration under Section 564(b)(1) of the Act, 21 U.S.C.section 360bbb-3(b)(1), unless the authorization is terminated  or revoked sooner.       Influenza A by PCR NEGATIVE NEGATIVE Final   Influenza B by PCR NEGATIVE NEGATIVE Final    Comment: (NOTE) The Xpert Xpress SARS-CoV-2/FLU/RSV plus assay is intended as an aid in the diagnosis of influenza from Nasopharyngeal swab specimens and should not be used as a sole basis for treatment. Nasal washings and aspirates are unacceptable for Xpert Xpress SARS-CoV-2/FLU/RSV testing.  Fact Sheet for Patients: BloggerCourse.com  Fact Sheet for Healthcare Providers: SeriousBroker.it  This test is not yet approved or cleared by the [place] FDA and has been authorized for detection and/or diagnosis of SARS-CoV-2 by FDA under an Emergency Use Authorization (EUA). This EUA will remain in effect (meaning this test can be used) for the duration of the COVID-19 declaration under Section 564(b)(1) of the Act, 21 U.S.C. section 360bbb-3(b)(1), unless the authorization is terminated or revoked.  Performed at [facility], [address] [place], [place] [address]   Respiratory (~20 pathogens) panel by PCR     Status: None   Collection Time: 03/29/21  3:01 AM  Specimen: Nasopharyngeal Swab; Respiratory  Result Value Ref Range Status   Adenovirus NOT DETECTED NOT DETECTED Final   Coronavirus 229E NOT DETECTED NOT DETECTED Final    Comment: (NOTE) The Coronavirus on the Respiratory Panel, DOES NOT test for the novel  Coronavirus (2019 nCoV)    Coronavirus HKU1 NOT DETECTED NOT DETECTED Final   Coronavirus NL63 NOT DETECTED NOT DETECTED Final   Coronavirus OC43 NOT DETECTED NOT DETECTED  Final   Metapneumovirus NOT DETECTED NOT DETECTED Final   Rhinovirus / Enterovirus NOT DETECTED NOT DETECTED Final   Influenza A NOT DETECTED NOT DETECTED Final   Influenza B NOT DETECTED NOT DETECTED Final   Parainfluenza Virus 1 NOT DETECTED NOT DETECTED Final   Parainfluenza Virus 2 NOT DETECTED NOT DETECTED Final   Parainfluenza Virus 3 NOT DETECTED NOT DETECTED Final   Parainfluenza Virus 4 NOT DETECTED NOT DETECTED Final   Respiratory Syncytial Virus NOT DETECTED NOT DETECTED Final   Bordetella pertussis NOT DETECTED NOT DETECTED Final   Bordetella Parapertussis NOT DETECTED NOT DETECTED Final   Chlamydophila pneumoniae NOT DETECTED NOT DETECTED Final   Mycoplasma pneumoniae NOT DETECTED NOT DETECTED Final    Comment: Performed at [facility], [address], [place], [place] [address]    Signed:  Jonah Blue MD.  [facility] 03/30/2021, 8:11 AM

## 2021-03-30 NOTE — Progress Notes (Addendum)
After giving scheduled medication and pain medication pt and mother called for assistance to the  room requesting to sign AMA forms. Pt mother stated pt was not getting care that was needed and was not satisfied. Despite asking if anything can be done to change in leaving what can be done but pt mother stated nothing can be done and wants to leave hospital. Pt informed that I will get AMA form to be signed and before that would put gauze on IV site that pt mother removed. After caring for removed IV site and returned to give pt AMA form. pt informed on what AMA stated pt mother interrupted and stated daughter will not sign form and left room. MD informed of above information

## 2021-09-18 ENCOUNTER — Encounter (HOSPITAL_BASED_OUTPATIENT_CLINIC_OR_DEPARTMENT_OTHER): Payer: Self-pay | Admitting: Emergency Medicine

## 2021-09-18 ENCOUNTER — Other Ambulatory Visit: Payer: Self-pay

## 2021-09-18 ENCOUNTER — Emergency Department (HOSPITAL_BASED_OUTPATIENT_CLINIC_OR_DEPARTMENT_OTHER)
Admission: EM | Admit: 2021-09-18 | Discharge: 2021-09-18 | Disposition: A | Discharge status: Home / Self Care | Payer: Medicaid Other | Attending: Emergency Medicine | Admitting: Emergency Medicine

## 2021-09-18 DIAGNOSIS — N898 Other specified noninflammatory disorders of vagina: Secondary | ICD-10-CM | POA: Insufficient documentation

## 2021-09-18 DIAGNOSIS — R112 Nausea with vomiting, unspecified: Secondary | ICD-10-CM

## 2021-09-18 DIAGNOSIS — R197 Diarrhea, unspecified: Secondary | ICD-10-CM | POA: Diagnosis not present

## 2021-09-18 HISTORY — DX: Unspecified asthma, uncomplicated: J45.909

## 2021-09-18 LAB — WET PREP, GENITAL
Clue Cells Wet Prep HPF POC: NONE SEEN
Sperm: NONE SEEN
Trich, Wet Prep: NONE SEEN
WBC, Wet Prep HPF POC: >=10 — AB (ref <10)
Yeast Wet Prep HPF POC: NONE SEEN

## 2021-09-18 LAB — COMPREHENSIVE METABOLIC PANEL
ALT: 17 U/L (ref 0–44)
AST: 24 U/L (ref 15–41)
Albumin: 4.1 g/dL (ref 3.5–5.0)
Alkaline Phosphatase: 55 U/L (ref 38–126)
Anion gap: 7 (ref 5–15)
BUN: 9 mg/dL (ref 6–20)
CO2: 22 mmol/L (ref 22–32)
Calcium: 9 mg/dL (ref 8.9–10.3)
Chloride: 107 mmol/L (ref 98–111)
Creatinine, Ser: 0.78 mg/dL (ref 0.44–1.00)
GFR, Estimated: >60 mL/min (ref >60)
Glucose, Bld: 98 mg/dL (ref 70–99)
Potassium: 4.1 mmol/L (ref 3.5–5.1)
Sodium: 136 mmol/L (ref 135–145)
Total Bilirubin: 0.5 mg/dL (ref 0.3–1.2)
Total Protein: 8.5 g/dL — ABNORMAL HIGH (ref 6.5–8.1)

## 2021-09-18 LAB — URINALYSIS, ROUTINE W REFLEX MICROSCOPIC
Bilirubin Urine: NEGATIVE
Glucose, UA: NEGATIVE mg/dL
Hgb urine dipstick: NEGATIVE
Ketones, ur: NEGATIVE mg/dL
Leukocytes,Ua: NEGATIVE
Nitrite: NEGATIVE
Protein, ur: NEGATIVE mg/dL
Specific Gravity, Urine: 1.025 (ref 1.005–1.030)
pH: 7 (ref 5.0–8.0)

## 2021-09-18 LAB — CBC
HCT: 41.5 % (ref 36.0–46.0)
Hemoglobin: 14.4 g/dL (ref 12.0–15.0)
MCH: 31.6 pg (ref 26.0–34.0)
MCHC: 34.7 g/dL (ref 30.0–36.0)
MCV: 91 fL (ref 80.0–100.0)
Platelets: 301 10*3/uL (ref 150–400)
RBC: 4.56 MIL/uL (ref 3.87–5.11)
RDW: 13.4 % (ref 11.5–15.5)
WBC: 9.5 10*3/uL (ref 4.0–10.5)
nRBC: 0 % (ref 0.0–0.2)

## 2021-09-18 LAB — LIPASE, BLOOD: Lipase: 23 U/L (ref 11–51)

## 2021-09-18 LAB — PREGNANCY, URINE: Preg Test, Ur: NEGATIVE

## 2021-09-18 MED ORDER — SODIUM CHLORIDE 0.9 % IV BOLUS
1000.0000 mL | Freq: Once | INTRAVENOUS | Status: AC
  Administered 2021-09-18: 1000 mL via INTRAVENOUS

## 2021-09-18 MED ORDER — FLUCONAZOLE 200 MG PO TABS: 200.0000 mg | ORAL_TABLET | Freq: Once | ORAL | Status: DC

## 2021-09-18 MED ORDER — ONDANSETRON 4 MG PO TBDP: 4.0000 mg | ORAL_TABLET | Freq: Three times a day (TID) | ORAL | 0 refills | Status: DC | PRN

## 2021-09-18 MED ORDER — FLUCONAZOLE 150 MG PO TABS
150.0000 mg | ORAL_TABLET | Freq: Once | ORAL | Status: AC
  Administered 2021-09-18: 150 mg via ORAL
  Filled 2021-09-18: qty 1

## 2021-09-18 MED ORDER — ONDANSETRON 4 MG PO TBDP: 4.0000 mg | ORAL_TABLET | Freq: Three times a day (TID) | ORAL | 0 refills | Status: AC | PRN

## 2021-09-18 NOTE — ED Notes (Signed)
Pt ambulatory to room with independent steady gait. Family at bedside.

## 2021-09-18 NOTE — ED Notes (Signed)
Patient reports that she has vomited twice today. States that she can not keep any food down . States that she can keep liquids down. States that her lower abdominal area is sore and tender.States that she fells as if she is going to get a headache but it never happens. States that  her skin has been itching for three days

## 2021-09-18 NOTE — Discharge Instructions (Signed)
I am prescribing you a medication called Zofran.  This is a disintegrating tablet you can use up to 3 times a day for management of your nausea and vomiting.  Please only take this as prescribed.  Please only take this if you are experiencing nausea and vomiting that you cannot control.  Also, like we discussed, please continue to monitor your symptoms closely.  Please check the results of your gonorrhea/chlamydia test on MyChart.  They should be available in the next 2 to 3 days.  If either test is positive you need to come back to the emergency department or your primary care provider to be treated.  Please refrain from sexual intercourse until you receive the results of this test.  If you develop any new or worsening symptoms whatsoever please come back to the emergency department.

## 2021-09-18 NOTE — ED Triage Notes (Signed)
Lower abdominal pain x 4 days , nausea and emesis post meals . Unsure if pregnant .

## 2021-09-18 NOTE — ED Provider Notes (Signed)
[facility] EMERGENCY DEPARTMENT Provider Note   CSN: [id number] Arrival date & time: 09/18/21  1702     History  Chief Complaint  Patient presents with   Abdominal Pain    Elizabeth Palmer is a 26 y.o. female.  HPI Patient is a 26 year old female with a history of marijuana use who presents to the emergency department due to nausea, vomiting, diarrhea for the past 4 days.  She states that her symptoms were persistent for about 3 days.  She states that she has had no diarrhea today.  She is still experiencing intermittent nausea and vomiting that worsens with p.o. intake.  Also reports some mild discomfort in the lower abdomen as well as an increase in vaginal discharge.  Denies any URI symptoms or urinary complaints.  States that she has been in a monogamous relationship with a female partner for the past four years.    Home Medications Prior to Admission medications   Medication Sig Start Date End Date Taking? Authorizing Provider  albuterol (PROVENTIL) (5 MG/ML) 0.5% nebulizer solution Take 0.5 mLs (2.5 mg total) by nebulization every 6 (six) hours as needed for wheezing or shortness of breath. Patient not taking: Reported on 03/29/2021 03/28/21   Henderly, Britni A, PA-C  albuterol (VENTOLIN HFA) 108 (90 Base) MCG/ACT inhaler Inhale 1-2 puffs into the lungs every 6 (six) hours as needed for wheezing or shortness of breath. 03/28/21   Henderly, Britni A, PA-C  azithromycin (ZITHROMAX) 250 MG tablet Take 1 tablet (250 mg total) by mouth daily. Take first 2 tablets together, then 1 every day until finished. Patient not taking: Reported on 03/29/2021 03/28/21   Henderly, Britni A, PA-C  benzonatate (TESSALON) 100 MG capsule Take 1 capsule (100 mg total) by mouth every 8 (eight) hours. 03/28/21   Henderly, Britni A, PA-C  budesonide-formoterol (SYMBICORT) 80-4.5 MCG/ACT inhaler Inhale 2 puffs into the lungs 2 (two) times daily.    [provider]  citalopram (CELEXA) 20 MG  tablet Take 20 mg by mouth every morning. 02/19/21   [provider]  dicyclomine (BENTYL) 20 MG tablet Take 1 tablet (20 mg total) by mouth 2 (two) times daily. Patient not taking: Reported on 03/29/2021 11/20/18   Horton, Mayer Masker, MD  fluticasone [facility]) 50 MCG/ACT nasal spray Place 1 spray into both nostrils daily as needed for allergies or rhinitis. 07/09/20   [provider]  hydrocortisone 2.5 % cream Apply 1 application topically 3 (three) times daily as needed (facial eczema). Use sparingly 03/11/21   [provider]  ondansetron (ZOFRAN-ODT) 4 MG disintegrating tablet Take 1 tablet (4 mg total) by mouth every 8 (eight) hours as needed for nausea or vomiting. 09/18/21   Placido Sou, PA-C  polyethylene glycol (MIRALAX / GLYCOLAX) 17 g packet Take 17 g by mouth daily as needed (constipation). 02/27/21   [provider]      Allergies    Patient has no known allergies.    Review of Systems   Review of Systems  All other systems reviewed and are negative. Ten systems reviewed and are negative for acute change, except as noted in the HPI.   Physical Exam Updated Vital Signs BP (!) 135/95 (BP Location: Right Arm)   Pulse 77   Temp 99.2 F (37.3 C) (Oral)   Resp 18   Ht 5' 2.5" (1.588 m)   Wt 97.5 kg   LMP 09/05/2021 (Exact Date)   SpO2 99%   BMI 38.70 kg/m  Physical Exam  Vitals and nursing note reviewed.  Constitutional:      General: She is not in acute distress.    Appearance: Normal appearance. She is not ill-appearing, toxic-appearing or diaphoretic.  HENT:     Head: Normocephalic and atraumatic.     Right Ear: External ear normal.     Left Ear: External ear normal.     Nose: Nose normal.     Mouth/Throat:     Mouth: Mucous membranes are moist.     Pharynx: Oropharynx is clear. No oropharyngeal exudate or posterior oropharyngeal erythema.  Eyes:     Extraocular Movements: Extraocular movements intact.  Cardiovascular:      Rate and Rhythm: Normal rate and regular rhythm.     Pulses: Normal pulses.     Heart sounds: Normal heart sounds. No murmur heard.   No friction rub. No gallop.  Pulmonary:     Effort: Pulmonary effort is normal. No respiratory distress.     Breath sounds: Normal breath sounds. No stridor. No wheezing, rhonchi or rales.  Abdominal:     General: Abdomen is flat.     Palpations: Abdomen is soft.     Tenderness: There is no abdominal tenderness.     Comments: Abdomen is flat, soft, and nontender.  Genitourinary:    Comments: Female nursing chaperone present.  Normal-appearing vulvar anatomy.  Normal-appearing vaginal mucosa.  Small amount of white curd-like discharge noted in the vaginal vault as well as on the cervix.  Cervix is mildly erythematous.  Closed cervix os.  No CMT. No adnexal tenderness. Musculoskeletal:        General: Normal range of motion.     Cervical back: Normal range of motion and neck supple. No tenderness.  Skin:    General: Skin is warm and dry.  Neurological:     General: No focal deficit present.     Mental Status: She is alert and oriented to person, place, and time.  Psychiatric:        Mood and Affect: Mood normal.        Behavior: Behavior normal.   ED Results / Procedures / Treatments   Labs (all labs ordered are listed, but only abnormal results are displayed) Labs Reviewed  WET PREP, GENITAL - Abnormal; Notable for the following components:      Result Value   WBC, Wet Prep HPF POC >=10 (*)    All other components within normal limits  COMPREHENSIVE METABOLIC PANEL - Abnormal; Notable for the following components:   Total Protein 8.5 (*)    All other components within normal limits  URINALYSIS, ROUTINE W REFLEX MICROSCOPIC - Abnormal; Notable for the following components:   APPearance HAZY (*)    All other components within normal limits  LIPASE, BLOOD  CBC  PREGNANCY, URINE  GC/CHLAMYDIA PROBE AMP () NOT AT [facility]    EKG None  Radiology No results found.  Procedures Procedures   Medications Ordered in ED Medications  sodium chloride 0.9 % bolus 1,000 mL (0 mLs Intravenous Stopped 09/18/21 1950)  fluconazole (DIFLUCAN) tablet 150 mg (150 mg Oral Given 09/18/21 1950)   ED Course/ Medical Decision Making/ A&P                           Medical Decision Making Amount and/or Complexity of Data Reviewed Labs: ordered.  Risk Prescription drug management.  Pt is a 26 y.o. female who presents to the emergency department due to nausea,  vomiting, diarrhea, as well as an increase in vaginal discharge.  Labs: CBC without abnormalities. Lipase within normal limits at 23. CMP with a total protein of 8.5. Urine pregnancy test negative. UA is hazy but otherwise within normal limits. Wet prep shows greater than 10 white blood cells.  I, Placido Sou, PA-C, personally reviewed and evaluated these images and lab results as part of my medical decision-making.  On my exam patient is afebrile, nontachycardic, and nontoxic-appearing.  Heart is regular rate and rhythm without murmurs, rubs, or gallops.  Lungs are clear to auscultation bilaterally.  Abdomen is flat, soft, and nontender in all 4 quadrants.  Pelvic exam performed with nursing chaperone present.  Normal vulvar anatomy.  Normal vaginal mucosa.  Erythematous cervix with a small amount of white curd-like discharge noted in the vaginal vault.  No cervical motion tenderness or adnexal tenderness.  Does not appear consistent with PID at this time.  Wet prep has since resulted and shows greater than 10 white blood cells but is negative for yeast, trichomonas, as well as clue cells, though the patient's exam does appear consistent with a likely yeast infection.  She also states she has had yeast infections in the past and feels that her symptoms are currently consistent with this.  She was given a single dose of flucanazole.  She states she has been in a  long-term monogamous relationship denies any concern for STI.  We discussed the significance of the elevated white blood cells on her wet prep.  She is going to check the results for gonorrhea/chlamydia test on MyChart and return if either test is positive.  Lab work otherwise appears reassuring.  CBC without leukocytosis.  Lipase of 23.  CMP with normal kidney function and no electrolyte derangements.  Elevated protein at 8.5.  Likely hemoconcentration in the setting of the patient's recent vomiting and diarrhea.  UA does not appear infectious.  Abdomen is soft and nontender.  Patient was given a liter of IV fluids.  She is now tolerating p.o. intake without difficulty.  Patient appears stable for discharge at this time and she is agreeable.  Will discharge on a short course of Zofran for breakthrough nausea and vomiting.  Discussed return precautions.  Her questions were answered and she was amicable at the time of discharge.  Note: Portions of this report may have been transcribed using voice recognition software. Every effort was made to ensure accuracy; however, inadvertent computerized transcription errors may be present.   Final Clinical Impression(s) / ED Diagnoses Final diagnoses:  Nausea vomiting and diarrhea  Vaginal discharge   Rx / DC Orders ED Discharge Orders          Ordered    ondansetron (ZOFRAN-ODT) 4 MG disintegrating tablet  Every 8 hours PRN,   Status:  Discontinued        09/18/21 2000    ondansetron (ZOFRAN-ODT) 4 MG disintegrating tablet  Every 8 hours PRN,   Status:  Discontinued        09/18/21 2007    ondansetron (ZOFRAN-ODT) 4 MG disintegrating tablet  Every 8 hours PRN,   Status:  Discontinued        09/18/21 2008    ondansetron (ZOFRAN-ODT) 4 MG disintegrating tablet  Every 8 hours PRN        09/18/21 2022              Placido Sou, PA-C 09/18/21 2355    Tegeler, Canary Brim, MD 09/19/21 [phone]

## 2021-09-19 LAB — GC/CHLAMYDIA PROBE AMP (~~LOC~~) NOT AT ARMC
Chlamydia: NEGATIVE
Comment: NEGATIVE
Comment: NORMAL
Neisseria Gonorrhea: NEGATIVE

## 2022-10-10 IMAGING — DX DG CHEST 1V PORT
1 series · 1 of 1 positions shown · non-contrast
Comparison: None.

CLINICAL DATA: Cough

EXAM:
PORTABLE CHEST 1 VIEW

[chest ap]
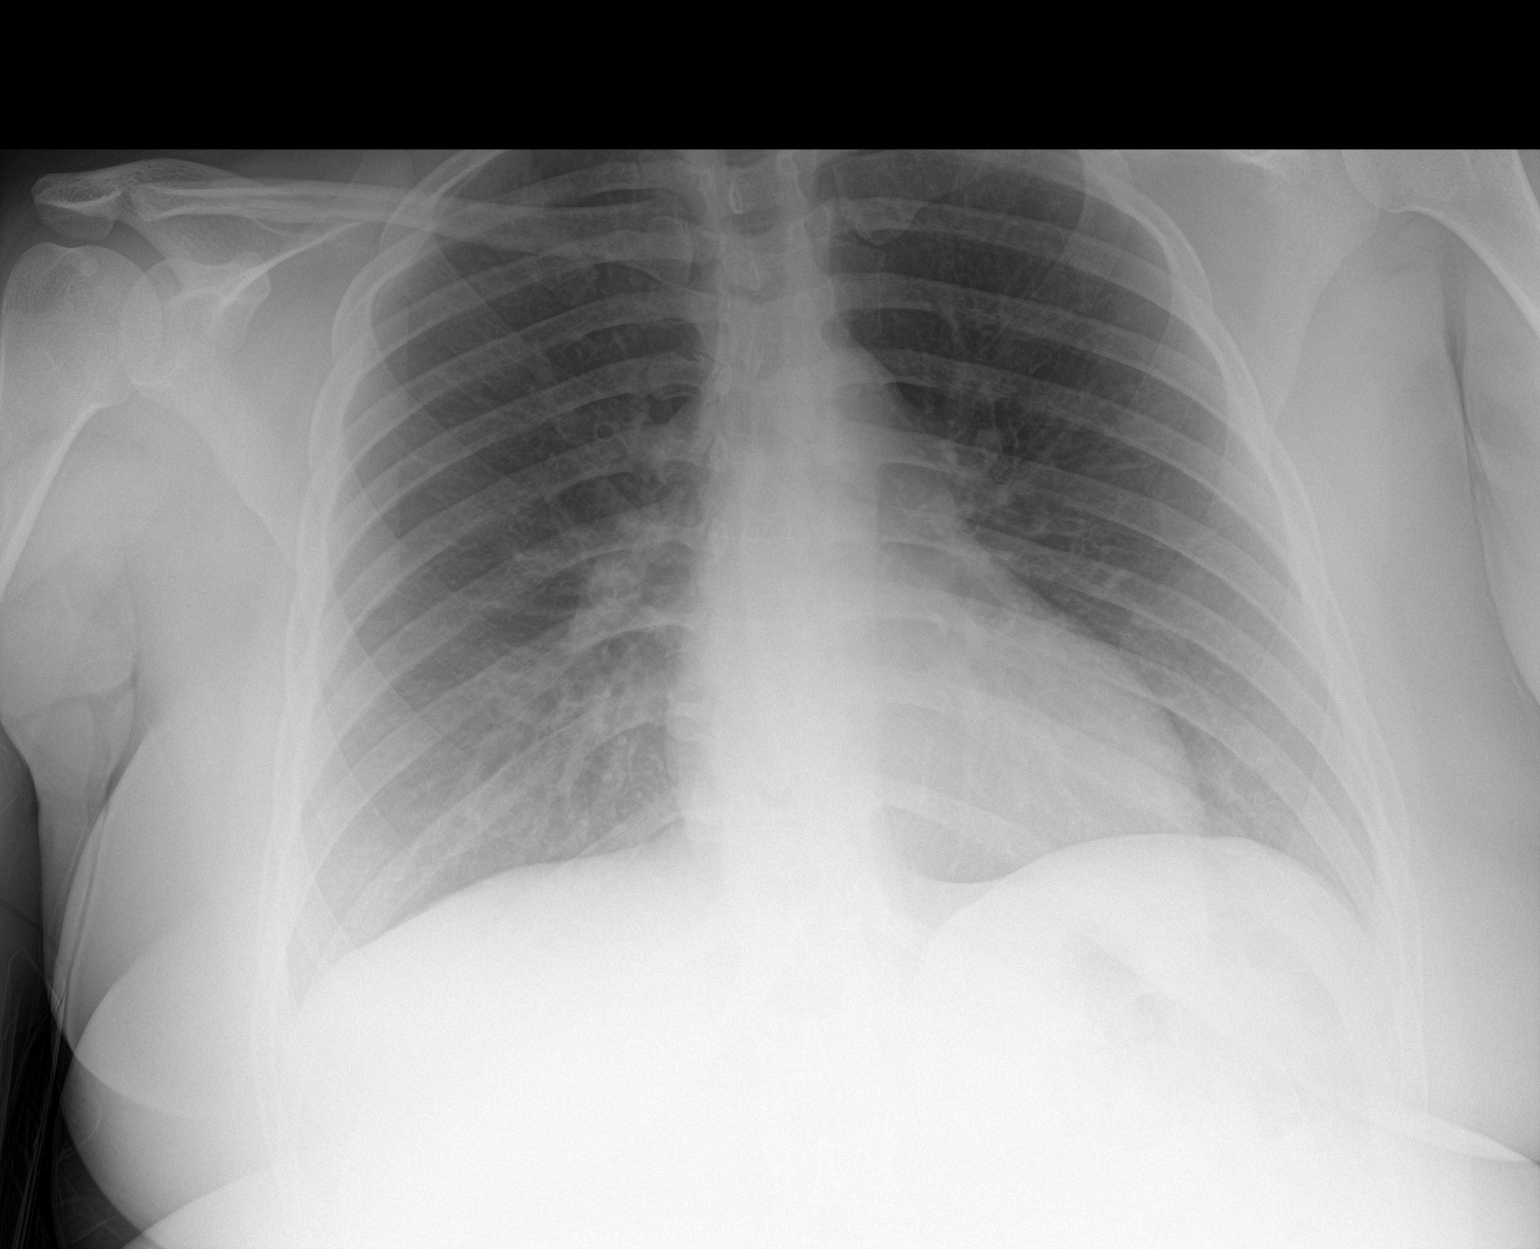

[1 of 1 positions shown; findings below may reference images not displayed]

FINDINGS: Heart size and mediastinal contours are within normal limits. No
suspicious pulmonary opacities identified.

No pleural effusion or pneumothorax visualized.

No acute osseous abnormality appreciated.
IMPRESSION: No acute intrathoracic process identified.

## 2023-07-15 ENCOUNTER — Ambulatory Visit: Payer: MEDICAID | Admitting: Internal Medicine

## 2023-08-16 ENCOUNTER — Other Ambulatory Visit: Payer: Self-pay

## 2023-08-16 ENCOUNTER — Encounter: Payer: Self-pay | Admitting: Internal Medicine

## 2023-08-16 ENCOUNTER — Ambulatory Visit (INDEPENDENT_AMBULATORY_CARE_PROVIDER_SITE_OTHER): Payer: MEDICAID | Admitting: Internal Medicine

## 2023-08-16 VITALS — BP 122/88 | HR 85 | Temp 98.2°F | Resp 18 | Ht 64.0 in | Wt 228.1 lb

## 2023-08-16 DIAGNOSIS — J4540 Moderate persistent asthma, uncomplicated: Secondary | ICD-10-CM | POA: Diagnosis not present

## 2023-08-16 DIAGNOSIS — J3089 Other allergic rhinitis: Secondary | ICD-10-CM

## 2023-08-16 DIAGNOSIS — H1045 Other chronic allergic conjunctivitis: Secondary | ICD-10-CM | POA: Diagnosis not present

## 2023-08-16 MED ORDER — LEVOCETIRIZINE DIHYDROCHLORIDE 5 MG PO TABS: 5.0000 mg | ORAL_TABLET | Freq: Every evening | ORAL | 1 refills | Status: DC

## 2023-08-16 MED ORDER — BUDESONIDE-FORMOTEROL FUMARATE 160-4.5 MCG/ACT IN AERO
2.0000 | INHALATION_SPRAY | Freq: Two times a day (BID) | RESPIRATORY_TRACT | 5 refills | Status: AC
Start: 2023-08-16 — End: ?

## 2023-08-16 MED ORDER — FLUTICASONE PROPIONATE 50 MCG/ACT NA SUSP: 2.0000 | Freq: Every day | NASAL | 2 refills | Status: DC

## 2023-08-16 MED ORDER — MONTELUKAST SODIUM 10 MG PO TABS: 10.0000 mg | ORAL_TABLET | Freq: Every day | ORAL | 1 refills | Status: DC

## 2023-08-16 NOTE — Progress Notes (Signed)
 NEW PATIENT Date of Service/Encounter:  08/16/23 Referring provider: Cozetta Divers, MD Primary care provider: [facility]  Subjective:  Elizabeth Palmer is a 28 y.o. female  presenting today for evaluation of rhinitis, conjunctivitis, asthma  History obtained from: chart review and patient.   Discussed the use of AI scribe software for clinical note transcription with the patient, who gave verbal consent to proceed.  History of Present Illness Elizabeth Palmer is a 28 year old female with asthma who presents with worsening upper airway symptoms.  She has a history of asthma, first diagnosed approximately two to three years ago as an adult. Her asthma symptoms began suddenly in December of that year, characterized by difficulty breathing, coughing, and wheezing, which led to an emergency room visit where she was diagnosed with asthma. She was treated with albuterol  and started on Symbicort  at that time. She continues to use Symbicort , two puffs twice daily, and adheres to this regimen. She also uses Ventolin  as needed, which has been more frequent recently due to high pollen levels.  In December 2024, she experienced a severe asthma exacerbation triggered by cleaning a rabbit's cage, resulting in a four to five-day hospitalization, including three days on BiPAP in the CCU. She reports no other hospitalizations for asthma but had an initial ER visit at the time of diagnosis. She cannot recall if she received oral steroids during her hospitalization. Her asthma is triggered by cold, dry air, strong odors, dust, and previously by a rabbit's hair. She reports no issues with other animals. She has been using Ventolin  more frequently this week due to pollen but generally uses it sparingly.  She also experiences upper airway rhinitis symptoms that began around 2020 or 2021, including itchy, watery eyes, sneezing, coughing due to mucus, and an itchy throat. These symptoms  sometimes lead to coughing fits and lightheadedness. She alternates between Zyrtec and Benadryl  for relief, as prolonged use of one seems to reduce its effectiveness. She previously used Flonase , which she found helpful, but has not used it recently due to a lack of refills. Dust and strong odors exacerbate her symptoms, but she has no history of sinus surgeries or recurrent infections.  Chart Review:  CXR 03/28/20: normal   Other allergy screening: Asthma: yes Rhino conjunctivitis: yes Food allergy: no Medication allergy: no Hymenoptera allergy: no Urticaria: no Eczema:no History of recurrent infections suggestive of immunodeficency: no Vaccinations are up to date.   Past Medical History: Past Medical History:  Diagnosis Date   Asthma    Eczema    Environmental and seasonal allergies    Medication List:  Current Outpatient Medications  Medication Sig Dispense Refill   albuterol  (PROVENTIL ) (5 MG/ML) 0.5% nebulizer solution Take 0.5 mLs (2.5 mg total) by nebulization every 6 (six) hours as needed for wheezing or shortness of breath. 20 mL 12   albuterol  (VENTOLIN  HFA) 108 (90 Base) MCG/ACT inhaler Inhale 1-2 puffs into the lungs every 6 (six) hours as needed for wheezing or shortness of breath. 6.7 g 0   benzonatate  (TESSALON ) 100 MG capsule Take 1 capsule (100 mg total) by mouth every 8 (eight) hours. 21 capsule 0   budesonide -formoterol  (SYMBICORT ) 160-4.5 MCG/ACT inhaler Inhale 2 puffs into the lungs 2 (two) times daily. 1 each 5   citalopram (CELEXA) 20 MG tablet Take 20 mg by mouth every morning.     dicyclomine  (BENTYL ) 20 MG tablet Take 1 tablet (20 mg total) by mouth 2 (two) times daily. 20 tablet 0  fluticasone  (FLONASE ) 50 MCG/ACT nasal spray Place 2 sprays into both nostrils daily. 16 g 2   hydrocortisone  2.5 % cream Apply 1 application topically 3 (three) times daily as needed (facial eczema). Use sparingly     levocetirizine (XYZAL ) 5 MG tablet Take 1 tablet (5 mg  total) by mouth every evening. 90 tablet 1   montelukast  (SINGULAIR ) 10 MG tablet Take 1 tablet (10 mg total) by mouth at bedtime. 90 tablet 1   ondansetron  (ZOFRAN -ODT) 4 MG disintegrating tablet Take 1 tablet (4 mg total) by mouth every 8 (eight) hours as needed for nausea or vomiting. 8 tablet 0   polyethylene glycol (MIRALAX  / GLYCOLAX ) 17 g packet Take 17 g by mouth daily as needed (constipation).     No current facility-administered medications for this visit.   Known Allergies:  Not on File Past Surgical History: History reviewed. No pertinent surgical history. Family History: Family History  Problem Relation Age of Onset   Asthma Sister    COPD Maternal Grandmother    Social History: [place] lives apt, water damage in the house and carpet throughout, rabbit and cat without access to bedroom, no roaches in the house and bed is 2 feet off the floor, no dust mite precautions, unemployed .  Smoked from [age], 2 cigarettes per day.    ROS:  All other systems negative except as noted per HPI.  Objective:  Blood pressure 122/88, pulse 85, temperature 98.2 F (36.8 C), temperature source Temporal, resp. rate 18, height 5\' 4"  (1.626 m), weight 228 lb 1.6 oz (103.5 kg), SpO2 99%. Body mass index is 39.15 kg/m. Physical Exam:  General Appearance:  Alert, cooperative, no distress, appears stated age  Head:  Normocephalic, without obvious abnormality, atraumatic  Eyes:  Conjunctiva clear, EOM's intact  Ears EACs normal bilaterally  Nose: Nares normal,  erythematous and edematous nasal mucosa , hypertrophic turbinates, no visible anterior polyps, and septum midline  Throat: Lips, tongue normal; teeth and gums normal, + cobblestoning  Neck: Supple, symmetrical  Lungs:   clear to auscultation bilaterally, Respirations unlabored, no coughing  Heart:  regular rate and rhythm and no murmur, Appears well perfused  Extremities: No edema  Skin: Skin color, texture, turgor normal and no rashes  or lesions on visualized portions of skin  Neurologic: No gross deficits   Diagnostics: Spirometry:  Tracings reviewed. Her effort: Good reproducible efforts. FVC: 2.87 L (pre),  FEV1: 2.50 L, 88% predicted (pre),  FEV1/FVC ratio: 87 (pre),  Interpretation: Spirometry consistent with normal pattern.  Please see scanned spirometry results for details.   Labs:  Lab Orders         CBC With Diff/Platelet         Allergens w/Total IgE Area 2       Assessment and Plan  Assessment and Plan Assessment & Plan Asthma, moderate persistent not well controlled  Asthma poorly controlled with increased Ventolin  use. High risk due to previous hospitalization. Compliance with Symbicort  noted. Step-up in care required to prevent exacerbations. Singulair  added to improve control. Allergy injections considered to address allergic triggers. - Increase Symbicort  dose to 160 mcg, two puffs twice daily with spacer. Rinse mouth after use  - Start Singulair  10 mg at night, monitor for nightmares. - Order labs to evaluate for special asthma medications. AEC, IgE - Educated on proper inhaler and nasal spray technique. - Use a spacer with all inhalers - please keep track of how often you are needing rescue inhaler Albuterol  (Proair /Ventolin ) as  this will help guide future management - Asthma is not controlled if:  - Symptoms are occurring >2 times a week  during the day  OR  - >2 times a month nighttime awakenings  - Please call the clinic to schedule a follow up if these symptoms arise     Allergic Rhinitis, not controlled  Symptoms exacerbated by pollen, dust and strong odors. Previously managed with Zyrtec, Benadryl , and Flonase .  - Restart Flonase  1 spray per nostril twice daily. Aim upward and outward  - Switch benadryl  to levocetirizine (xyzal ) 5mg  daily.  Hold this for 3 days prior to allergy testing appointment  - Schedule allergy testing (1-55)    Follow up Monday 4/28 at 9:00 Am for  allergy testing (1-55)    This note in its entirety was forwarded to the Provider who requested this consultation.  Other: reviewed spirometry technique and reviewed inhaler technique  Thank you for your kind referral. I appreciate the opportunity to take part in Kristinia's care. Please do not hesitate to contact me with questions.  Sincerely,  Thank you so much for letting me partake in your care today.  Don't hesitate to reach out if you have any additional concerns!  Orelia Binet, MD  [facility] [place], [place]

## 2023-08-16 NOTE — Patient Instructions (Signed)
 Asthma, moderate persistent not well controlled  Asthma poorly controlled with increased Ventolin  use. High risk due to previous hospitalization. Compliance with Symbicort  noted. Step-up in care required to prevent exacerbations. Singulair  added to improve control. Allergy injections considered to address allergic triggers. - Increase Symbicort  dose to 160 mcg, two puffs twice daily with spacer. Rinse mouth after use  - Start Singulair  10 mg at night, monitor for nightmares. - Order labs to evaluate for special asthma medications. AEC, IgE - Educated on proper inhaler and nasal spray technique. - Use a spacer with all inhalers - please keep track of how often you are needing rescue inhaler Albuterol  (Proair /Ventolin ) as this will help guide future management - Asthma is not controlled if:  - Symptoms are occurring >2 times a week  during the day  OR  - >2 times a month nighttime awakenings  - Please call the clinic to schedule a follow up if these symptoms arise     Allergic Rhinitis, not controlled  Symptoms exacerbated by pollen, dust and strong odors. Previously managed with Zyrtec, Benadryl , and Flonase .  - Restart Flonase  1 spray per nostril twice daily. Aim upward and outward  - Switch benadryl  to levocetirizine (xyzal ) 5mg  daily.  Hold this for 3 days prior to allergy testing appointment  - Schedule allergy testing (1-55)    Follow up Monday 4/28 at 9:00 Am for allergy testing (1-55)

## 2023-08-23 ENCOUNTER — Ambulatory Visit (INDEPENDENT_AMBULATORY_CARE_PROVIDER_SITE_OTHER): Payer: MEDICAID | Admitting: Internal Medicine

## 2023-08-23 DIAGNOSIS — J4540 Moderate persistent asthma, uncomplicated: Secondary | ICD-10-CM

## 2023-08-23 DIAGNOSIS — H1045 Other chronic allergic conjunctivitis: Secondary | ICD-10-CM | POA: Diagnosis not present

## 2023-08-23 DIAGNOSIS — J3089 Other allergic rhinitis: Secondary | ICD-10-CM | POA: Diagnosis not present

## 2023-08-23 DIAGNOSIS — J302 Other seasonal allergic rhinitis: Secondary | ICD-10-CM

## 2023-08-23 NOTE — Progress Notes (Signed)
 Date of Service/Encounter:  08/23/23  Allergy testing appointment   Initial visit on 08/16/23, seen for asthma, .  Please see that note for additional details.  Today reports for allergy diagnostic testing:    DIAGNOSTICS:  Skin Testing: Environmental allergy panel. Adequate positive and negative controls Results discussed with patient/family.  Airborne Adult Perc - 08/23/23 0903     Time Antigen Placed 1610    Allergen Manufacturer Floyd Hutchinson    Location Back    Number of Test 55    1. Control-Buffer 50% Glycerol Negative    2. Control-Histamine 3+    3. Bahia Negative    4. [place] Negative    5. Johnson Negative    6. [place]  Blue Negative    7. Meadow Fescue Negative    8. Perennial Rye Negative    9. Timothy Negative    10. Ragweed Mix Negative    11. Cocklebur Negative    12. Plantain,  English Negative    13. Baccharis Negative    14. Dog Fennel Negative    15. Russian Thistle Negative    16. Lamb's Quarters Negative    17. Sheep Sorrell Negative    18. Rough Pigweed Negative    19. Marsh Elder, Rough Negative    20. Mugwort, Common Negative    21. Box, Elder Negative    22. Cedar, red Negative    23. Sweet Gum Negative    24. Pecan Pollen Negative    25. Pine Mix Negative    26. Walnut, Black Pollen Negative    27. Red Mulberry Negative    28. Ash Mix Negative    29. Birch Mix Negative    30. Beech American Negative    31. Cottonwood, Guinea-Bissau Negative    32. Hickory, White Negative    33. Maple Mix Negative    34. Oak, [place] Mix Negative    35. Sycamore Eastern Negative    36. Alternaria Alternata Negative    37. Cladosporium Herbarum Negative    38. Aspergillus Mix Negative    39. Penicillium Mix Negative    40. Bipolaris Sorokiniana (Helminthosporium) Negative    41. Drechslera Spicifera (Curvularia) Negative    42. Mucor Plumbeus Negative    43. Fusarium Moniliforme Negative    44. Aureobasidium Pullulans (pullulara) Negative    45. Rhizopus  Oryzae Negative    46. Botrytis Cinera Negative    47. Epicoccum Nigrum Negative    48. Phoma Betae Negative    49. Dust Mite Mix Negative    50. Cat Hair 10,000 BAU/ml 2+    51.  Dog Epithelia Negative    52. Mixed Feathers Negative    53. Horse Epithelia Negative    54. Cockroach, German Negative    55. Tobacco Leaf Negative             Intradermal - 08/23/23 0950     Time Antigen Placed 0950    Allergen Manufacturer Other    Location Arm    Number of Test 15    Control Negative    Bahia Negative    [place] 2+    Johnson Negative    7 Grass Negative    Ragweed Mix Negative    Weed Mix Negative    Tree Mix Negative    Mold 1 Negative    Mold 2 2+    Mold 3 Negative    Mold 4 2+    Mite Mix 2+    Dog Negative  Cockroach Negative             Allergy testing results were read and interpreted by myself, documented by clinical staff.  Patient provided with copy of allergy testing along with avoidance measures when indicated.   Orelia Binet, MD  [facility] 

## 2023-08-23 NOTE — Patient Instructions (Addendum)
 Allergy test (08/23/23): cat, [place] grass, mold, dust mite  Start avoidance measures   Asthma, moderate persistent  Asthma poorly controlled with increased Ventolin  use. High risk due to previous hospitalization. Compliance with Symbicort  noted. Step-up in care required to prevent exacerbations. Singulair  added to improve control. Allergy injections considered to address allergic triggers. - Continue  Symbicort  dose to 160 mcg, two puffs twice daily with spacer. Rinse mouth after use  - Continue  Singulair  10 mg at night, monitor for nightmares. - Order labs to evaluate for special asthma medications. AEC, IgE - Educated on proper inhaler and nasal spray technique. - Use a spacer with all inhalers - please keep track of how often you are needing rescue inhaler Albuterol  (Proair /Ventolin ) as this will help guide future management - Asthma is not controlled if:  - Symptoms are occurring >2 times a week  during the day  OR  - >2 times a month nighttime awakenings  - Please call the clinic to schedule a follow up if these symptoms arise     Allergic Rhinitis,  Symptoms exacerbated by pollen, dust and strong odors. Previously managed with Zyrtec, Benadryl , and Flonase .  - Continue Flonase  1 spray per nostril twice daily. Aim upward and outward  - Continue  levocetirizine (xyzal ) 5mg  daily.    Follow up: 2 months   Thank you so much for letting me partake in your care today.  Don't hesitate to reach out if you have any additional concerns!  Orelia Binet, MD  [facility] [place], [place]  Control of Dog or Cat Allergen  Avoidance is the best way to manage a dog or cat allergy. If you have a dog or cat and are allergic to dog or cats, consider removing the dog or cat from the home. If you have a dog or cat but don't want to find it a new home, or if your family wants a pet even though someone in the household is allergic, here are some strategies that may help keep  symptoms at bay:  Keep the pet out of your bedroom and restrict it to only a few rooms. Be advised that keeping the dog or cat in only one room will not limit the allergens to that room. Don't pet, hug or kiss the dog or cat; if you do, wash your hands with soap and water. High-efficiency particulate air (HEPA) cleaners run continuously in a bedroom or living room can reduce allergen levels over time. Regular use of a high-efficiency vacuum cleaner or a central vacuum can reduce allergen levels. Giving your dog or cat a bath at least once a week can reduce airborne allergen.  Reducing Pollen Exposure  The American Academy of Allergy, Asthma and Immunology suggests the following steps to reduce your exposure to pollen during allergy seasons.    Do not hang sheets or clothing out to dry; pollen may collect on these items. Do not mow lawns or spend time around freshly cut grass; mowing stirs up pollen. Keep windows closed at night.  Keep car windows closed while driving. Minimize morning activities outdoors, a time when pollen counts are usually at their highest. Stay indoors as much as possible when pollen counts or humidity is high and on windy days when pollen tends to remain in the air longer. Use air conditioning when possible.  Many air conditioners have filters that trap the pollen spores. Use a HEPA room air filter to remove pollen form the indoor air you breathe.  Control of Mold Allergen   Mold and fungi can grow on a variety of surfaces provided certain temperature and moisture conditions exist.  Outdoor molds grow on plants, decaying vegetation and soil.  The major outdoor mold, Alternaria and Cladosporium, are found in very high numbers during hot and dry conditions.  Generally, a late Summer - Fall peak is seen for common outdoor fungal spores.  Rain will temporarily lower outdoor mold spore count, but counts rise rapidly when the rainy period ends.  The most important indoor molds  are Aspergillus and Penicillium.  Dark, humid and poorly ventilated basements are ideal sites for mold growth.  The next most common sites of mold growth are the bathroom and the kitchen.  Outdoor (Seasonal) Mold Control  Use air conditioning and keep windows closed Avoid exposure to decaying vegetation. Avoid leaf raking. Avoid grain handling. Consider wearing a face mask if working in moldy areas.    Indoor (Perennial) Mold Control   Maintain humidity below 50%. Clean washable surfaces with 5% bleach solution. Remove sources e.g. contaminated carpets.    DUST MITE AVOIDANCE MEASURES:  There are three main measures that need and can be taken to avoid house dust mites:  Reduce accumulation of dust in general -reduce furniture, clothing, carpeting, books, stuffed animals, especially in bedroom  Separate yourself from the dust -use pillow and mattress encasements (can be found at stores such as [employer] or online) -avoid direct exposure to air condition flow -use a HEPA filter device, especially in the bedroom; you can also use a HEPA filter vacuum cleaner -wipe dust with a moist towel instead of a dry towel or broom when cleaning  Decrease mites and/or their secretions -wash clothing and linen and stuffed animals at highest temperature possible, at least every 2 weeks -stuffed animals can also be placed in a bag and put in a freezer overnight  Despite the above measures, it is impossible to eliminate dust mites or their allergen completely from your home.  With the above measures the burden of mites in your home can be diminished, with the goal of minimizing your allergic symptoms.  Success will be reached only when implementing and using all means together.

## 2023-10-25 ENCOUNTER — Ambulatory Visit: Payer: MEDICAID | Admitting: Internal Medicine

## 2023-11-02 ENCOUNTER — Encounter: Payer: Self-pay | Admitting: Internal Medicine

## 2023-11-02 ENCOUNTER — Ambulatory Visit (INDEPENDENT_AMBULATORY_CARE_PROVIDER_SITE_OTHER): Payer: MEDICAID | Admitting: Internal Medicine

## 2023-11-02 VITALS — BP 134/82 | HR 104 | Temp 97.9°F | Resp 20 | Ht 63.5 in | Wt 225.0 lb

## 2023-11-02 DIAGNOSIS — J4540 Moderate persistent asthma, uncomplicated: Secondary | ICD-10-CM | POA: Diagnosis not present

## 2023-11-02 DIAGNOSIS — J3089 Other allergic rhinitis: Secondary | ICD-10-CM | POA: Diagnosis not present

## 2023-11-02 DIAGNOSIS — H1045 Other chronic allergic conjunctivitis: Secondary | ICD-10-CM

## 2023-11-02 DIAGNOSIS — J302 Other seasonal allergic rhinitis: Secondary | ICD-10-CM | POA: Diagnosis not present

## 2023-11-02 NOTE — Patient Instructions (Addendum)
 Allergy  test (08/23/23): cat, french [place] grass, mold, dust mite  Continue avoidance measures   We will get biologic labs   Asthma, moderate persistent  Breathing test: looks great - Continue  Symbicort  dose to 160 mcg, two puffs twice daily with spacer. Rinse mouth after use  - Continue  Singulair  10 mg at night,  - Start Albuterol  2 puff 10-15 minutes before exercise  - Continue Albuterol  2 puffs every 4-6 hours as needed for cough, wheeze, dypsnea  - Use a spacer with all inhalers - please keep track of how often you are needing rescue inhaler Albuterol  (Proair /Ventolin ) as this will help guide future management - Asthma is not controlled if:  - Symptoms are occurring >2 times a week  during the day  OR  - >2 times a month nighttime awakenings  - Please call the clinic to schedule a follow up if these symptoms arise    Allergic Rhinitis, controlled  Symptoms exacerbated by pollen, dust and strong odors. Previously managed with Zyrtec, Benadryl , and Flonase .  - Continue Flonase  1 spray per nostril twice daily. Aim upward and outward  - Continue  levocetirizine (xyzal ) 5mg  daily.    Follow up: 3 months   Thank you so much for letting me partake in your care today.  Don't hesitate to reach out if you have any additional concerns!  Hargis Springer, MD  [facility] [place], [place]

## 2023-11-02 NOTE — Progress Notes (Unsigned)
 FOLLOW UP Date of Service/Encounter:  11/04/23  Subjective:  Elizabeth Palmer (DOB: 1996/01/30) is a 28 y.o. female who [facility] in re-evaluation of the following: asthma, rhinitis  History obtained from: chart review and patient.  For Review, LV was on 08/23/23  with Dr. Lorin seen for skin testing . See below for summary of history and diagnostics.  ----------------------------------------------------- Pertinent History/Diagnostics:  Asthma: Onset around age 82, slowly progressive symptoms,  5 day hospitlization requiring BiPAP in December 2025; triggered by cold air, strong odors, dust, rabbit exposure  Rx: symbicort  , singulair   - normal  spirometry (08/16/23): ratio 87, 2.50L FEV1 (pre),  Allergic Rhinitis:  Symptoms exacerbated by pollen, dust and strong odors.  Rx: flonase , xyzal   - SPT environmental panel (08/23/23): cat, [place] grass, mold, dust mite   --------------------------------------------------- Today presents for follow-up. Discussed the use of AI scribe software for clinical note transcription with the patient, who gave verbal consent to proceed.  History of Present Illness Elizabeth Palmer is a 28 year old female with asthma who presents for follow-up of her asthma management. She is accompanied by her mother.  Asthma symptoms and control - Adheres to Symbicort  regimen, taking two puffs twice daily without missed doses - Uses albuterol  rescue inhaler approximately once daily, primarily during strenuous activities such as gym workouts - Previously required albuterol  three to four times daily - Does not use albuterol  prophylactically before exercise - Experiences shortness of breath during significant physical exertion, occasionally requiring rescue inhaler if resting does not relieve symptoms - No upper airway symptoms including runny nose, nasal congestion, or itchy, watery eyes - History of hospitalization  related to asthma  Medication tolerance and preventive measures - Current medications include Symbicort , Singulair , Flonase , and Xyzal  - No reported medication side effects - Practices good oral hygiene by rinsing mouth after Symbicort  use to prevent thrush - Uses mouthwash and a tongue scraper regularly     All medications reviewed by clinical staff and updated in chart. No new pertinent medical or surgical history except as noted in HPI.  ROS: All others negative except as noted per HPI.   Objective:  BP 134/82 (BP Location: Left Arm, Patient Position: Sitting, Cuff Size: Large)   Pulse (!) 104   Temp 97.9 F (36.6 C) (Temporal)   Resp 20   Ht 5' 3.5 (1.613 m)   Wt 225 lb (102.1 kg)   SpO2 97%   BMI 39.23 kg/m  Body mass index is 39.23 kg/m. Physical Exam: General Appearance:  Alert, cooperative, no distress, appears stated age  Head:  Normocephalic, without obvious abnormality, atraumatic  Eyes:  Conjunctiva clear, EOM's intact  Ears EACs normal bilaterally  Nose: Nares normal, normal mucosa, no visible anterior polyps, and septum midline  Throat: Lips, tongue normal; teeth and gums normal, normal posterior oropharynx  Neck: Supple, symmetrical  Lungs:   clear to auscultation bilaterally, Respirations unlabored, no coughing  Heart:  regular rate and rhythm and no murmur, Appears well perfused  Extremities: No edema  Skin: Skin color, texture, turgor normal and no rashes or lesions on visualized portions of skin  Neurologic: No gross deficits   Labs:  Lab Orders  No laboratory test(s) ordered today    Spirometry:  Tracings reviewed. Her effort: Good reproducible efforts. FVC: 2.68 L FEV1: 2.43 L, 86% predicted FEV1/FVC ratio: 88% Interpretation: Spirometry consistent with normal pattern.  Please see scanned spirometry results for details.  Assessment/Plan   Patient  Instructions  Allergy  test (08/23/23): cat, [place] grass, mold, dust mite  Continue  avoidance measures   We will get biologic labs   Asthma, moderate persistent  Breathing test: looks great - Continue  Symbicort  dose to 160 mcg, two puffs twice daily with spacer. Rinse mouth after use  - Continue  Singulair  10 mg at night,  - Start Albuterol  2 puff 10-15 minutes before exercise  - Continue Albuterol  2 puffs every 4-6 hours as needed for cough, wheeze, dypsnea  - Use a spacer with all inhalers - please keep track of how often you are needing rescue inhaler Albuterol  (Proair /Ventolin ) as this will help guide future management - Asthma is not controlled if:  - Symptoms are occurring >2 times a week  during the day  OR  - >2 times a month nighttime awakenings  - Please call the clinic to schedule a follow up if these symptoms arise    Allergic Rhinitis, controlled  Symptoms exacerbated by pollen, dust and strong odors. Previously managed with Zyrtec, Benadryl , and Flonase .  - Continue Flonase  1 spray per nostril twice daily. Aim upward and outward  - Continue  levocetirizine (xyzal ) 5mg  daily.    Follow up: 3 months   Thank you so much for letting me partake in your care today.  Don't hesitate to reach out if you have any additional concerns!  Hargis Springer, MD  [facility] [place], [place]   Other: reviewed spirometry technique and reviewed inhaler technique  Thank you so much for letting me partake in your care today.  Don't hesitate to reach out if you have any additional concerns!  Hargis Springer, MD  [facility]- [place], [place]

## 2023-11-04 MED ORDER — MONTELUKAST SODIUM 10 MG PO TABS: 10.0000 mg | ORAL_TABLET | Freq: Every day | ORAL | 1 refills | Status: AC

## 2023-11-04 MED ORDER — LEVOCETIRIZINE DIHYDROCHLORIDE 5 MG PO TABS: 5.0000 mg | ORAL_TABLET | Freq: Every evening | ORAL | 1 refills | Status: DC

## 2024-02-02 ENCOUNTER — Encounter: Payer: Self-pay | Admitting: Internal Medicine

## 2024-02-02 ENCOUNTER — Ambulatory Visit (INDEPENDENT_AMBULATORY_CARE_PROVIDER_SITE_OTHER): Payer: MEDICAID | Admitting: Internal Medicine

## 2024-02-02 VITALS — BP 110/74 | HR 64 | Temp 98.1°F | Resp 20 | Wt 225.4 lb

## 2024-02-02 DIAGNOSIS — J3089 Other allergic rhinitis: Secondary | ICD-10-CM

## 2024-02-02 DIAGNOSIS — H1045 Other chronic allergic conjunctivitis: Secondary | ICD-10-CM

## 2024-02-02 DIAGNOSIS — J4540 Moderate persistent asthma, uncomplicated: Secondary | ICD-10-CM | POA: Diagnosis not present

## 2024-02-02 DIAGNOSIS — J302 Other seasonal allergic rhinitis: Secondary | ICD-10-CM

## 2024-02-02 MED ORDER — LEVOCETIRIZINE DIHYDROCHLORIDE 5 MG PO TABS: 5.0000 mg | ORAL_TABLET | Freq: Every evening | ORAL | 1 refills | Status: AC

## 2024-02-02 NOTE — Progress Notes (Signed)
 "  FOLLOW UP Date of Service/Encounter:  02/02/24  Subjective:  Elizabeth Palmer (DOB: Aug 22, 1995) is a 28 y.o. female who [facility]24 in re-evaluation of the following: asthma, rhinitis  History obtained from: chart review and patient.  For Review, LV was on 11/02/23  with Dr. Lorin seen for routine follow-up. See below for summary of history and diagnostics.   At last visit: FEV1: 2.43 L, 86% predicted ----------------------------------------------------- Pertinent History/Diagnostics:  Asthma: Onset around age 62, slowly progressive symptoms,  5 day hospitlization requiring BiPAP in December 2025; triggered by cold air, strong odors, dust, rabbit exposure  Rx: symbicort  , singulair   - normal  spirometry (08/16/23): ratio 87, 2.50L FEV1 (pre),  Allergic Rhinitis:  Symptoms exacerbated by pollen, dust and strong odors.  Rx: flonase , xyzal   - SPT environmental panel (08/23/23): cat, [place] grass, mold, dust mite   --------------------------------------------------- Today presents for follow-up. Discussed the use of AI scribe software for clinical note transcription with the patient, who gave verbal consent to proceed.  History of Present Illness Avelina Degregorio is a 28 year old female with asthma who presents for follow-up and management of her asthma and allergies.  Asthma control and exacerbation history - Asthma managed with Symbicort  160 mcg, two puffs twice daily, and montelukast  at night - Albuterol  used approximately once or twice per week - No nighttime asthma symptoms - No need for prednisone  or antibiotics since last appointment in July - No emergency room or urgent care visits since last appointment - History of severe asthma exacerbations, including hospitalization last winter due to bronchitis - Asthma symptoms tend to worsen in December with cold, dry weather - No recent need for nebulizer use - did not get  biologic labs at next visit, but is open to getting them today   Allergic rhinitis and environmental triggers - Takes Flonase  and Xyzal  (levocetirizine) once daily for allergy  management - Increased sneezing and nasal congestion after deep cleaning at home, attributed to dust exposure - Frequent nose blowing and persistent nasal congestion  Upper respiratory symptoms - Feels mucousy with a slight cough due to increase dust exposure over the weekend while deep cleaning house  - Feels congested - Concerned about possibly developing a mild viral upper respiratory infection     All medications reviewed by clinical staff and updated in chart. No new pertinent medical or surgical history except as noted in HPI.  ROS: All others negative except as noted per HPI.   Objective:  BP 110/74 (BP Location: Left Arm, Patient Position: Sitting, Cuff Size: Large)   Pulse 64   Temp 98.1 F (36.7 C) (Temporal)   Resp 20   Wt 225 lb 6.4 oz (102.2 kg)   SpO2 96%   BMI 39.30 kg/m  Body mass index is 39.3 kg/m. Physical Exam: General Appearance:  Alert, cooperative, no distress, appears stated age  Head:  Normocephalic, without obvious abnormality, atraumatic  Eyes:  Conjunctiva clear, EOM's intact  Ears EACs normal bilaterally  Nose: Nares normal, erythematous nasal mucosa, no visible anterior polyps, and septum midline  Throat: Lips, tongue normal; teeth and gums normal, normal posterior oropharynx  Neck: Supple, symmetrical  Lungs:   clear to auscultation bilaterally, Respirations unlabored, no coughing  Heart:  regular rate and rhythm and no murmur, Appears well perfused  Extremities: No edema  Skin: Skin color, texture, turgor normal and no rashes or lesions on visualized portions of skin  Neurologic: No gross deficits  Labs:  Lab Orders  No laboratory test(s) ordered today    Spirometry:  Tracings reviewed. Her effort: Good reproducible efforts. FVC: 2.84L FEV1: 2.51 L, 89%  predicted FEV1/FVC ratio: 88% Interpretation: Spirometry consistent with normal pattern.  Please see scanned spirometry results for details.  Assessment/Plan   Patient Instructions  Allergy  test (08/23/23): cat, [place] grass, mold, dust mite  Continue avoidance measures   We will get biologic labs today   Asthma, moderate persistent  Breathing test: looks great - Continue  Symbicort  160 mcg, two puffs twice daily with spacer. Rinse mouth after use   - can increase to 4 puffs twice daily for 1-2 weeks during respiratory illness  - Continue  Singulair  10 mg at night,  - Continue Albuterol  2 puff 10-15 minutes before exercise  - Continue Albuterol  2 puffs every 4-6 hours as needed for cough, wheeze, dypsnea  - Use a spacer with all inhalers - please keep track of how often you are needing rescue inhaler Albuterol  (Proair /Ventolin ) as this will help guide future management - Asthma is not controlled if:  - Symptoms are occurring >2 times a week  during the day  OR  - >2 times a month nighttime awakenings  - Please call the clinic to schedule a follow up if these symptoms arise    Allergic Rhinitis, controlled   - Continue Flonase  1 spray per nostril twice daily. Aim upward and outward  - Continue  levocetirizine (xyzal ) 5mg  daily.    Can increase flonase  and xyzal  to twice daily for a week during increase symptoms   Follow up: 4 months   Thank you so much for letting me partake in your care today.  Don't hesitate to reach out if you have any additional concerns!  Hargis Springer, MD  [facility] [place], [place]  Other: reviewed spirometry technique and reviewed inhaler technique  Thank you so much for letting me partake in your care today.  Don't hesitate to reach out if you have any additional concerns!  Hargis Springer, MD  [facility]- , [place]        "

## 2024-02-02 NOTE — Patient Instructions (Addendum)
 Allergy  test (08/23/23): cat, [place] grass, mold, dust mite  Continue avoidance measures   We will get biologic labs today   Asthma, moderate persistent  Breathing test: looks great - Continue  Symbicort  160 mcg, two puffs twice daily with spacer. Rinse mouth after use   - can increase to 4 puffs twice daily for 1-2 weeks during respiratory illness  - Continue  Singulair  10 mg at night,  - Continue Albuterol  2 puff 10-15 minutes before exercise  - Continue Albuterol  2 puffs every 4-6 hours as needed for cough, wheeze, dypsnea  - Use a spacer with all inhalers - please keep track of how often you are needing rescue inhaler Albuterol  (Proair /Ventolin ) as this will help guide future management - Asthma is not controlled if:  - Symptoms are occurring >2 times a week  during the day  OR  - >2 times a month nighttime awakenings  - Please call the clinic to schedule a follow up if these symptoms arise    Allergic Rhinitis, controlled   - Continue Flonase  1 spray per nostril twice daily. Aim upward and outward  - Continue  levocetirizine (xyzal ) 5mg  daily.    Can increase flonase  and xyzal  to twice daily for a week during increase symptoms   Follow up: 4 months   Thank you so much for letting me partake in your care today.  Don't hesitate to reach out if you have any additional concerns!  Hargis Springer, MD  [facility]- [place], [place]

## 2024-04-22 ENCOUNTER — Other Ambulatory Visit: Payer: Self-pay | Admitting: Internal Medicine

## 2024-05-12 ENCOUNTER — Other Ambulatory Visit: Payer: Self-pay | Admitting: Internal Medicine

## 2024-06-07 ENCOUNTER — Ambulatory Visit: Payer: MEDICAID | Admitting: Internal Medicine
